# Patient Record
Sex: Female | Born: 1970 | Race: White | Hispanic: Yes | Smoking: Former smoker
Health system: Southern US, Community
[De-identification: ages and names within clinical notes are randomized; demographics above are authoritative.]

## PROBLEM LIST (undated history)

## (undated) DIAGNOSIS — E119 Type 2 diabetes mellitus without complications: Secondary | ICD-10-CM

## (undated) DIAGNOSIS — D649 Anemia, unspecified: Secondary | ICD-10-CM

## (undated) DIAGNOSIS — B351 Tinea unguium: Secondary | ICD-10-CM

## (undated) DIAGNOSIS — R748 Abnormal levels of other serum enzymes: Secondary | ICD-10-CM

## (undated) DIAGNOSIS — E669 Obesity, unspecified: Secondary | ICD-10-CM

## (undated) DIAGNOSIS — F329 Major depressive disorder, single episode, unspecified: Secondary | ICD-10-CM

## (undated) DIAGNOSIS — R739 Hyperglycemia, unspecified: Secondary | ICD-10-CM

## (undated) DIAGNOSIS — E7800 Pure hypercholesterolemia, unspecified: Secondary | ICD-10-CM

## (undated) DIAGNOSIS — F32A Depression, unspecified: Secondary | ICD-10-CM

## (undated) HISTORY — DX: Depression, unspecified: F32.A

## (undated) HISTORY — DX: Anemia, unspecified: D64.9

## (undated) HISTORY — PX: APPENDECTOMY: SHX54

## (undated) HISTORY — DX: Abnormal levels of other serum enzymes: R74.8

## (undated) HISTORY — DX: Pure hypercholesterolemia, unspecified: E78.00

## (undated) HISTORY — PX: TUBAL LIGATION: SHX77

## (undated) HISTORY — DX: Tinea unguium: B35.1

## (undated) HISTORY — DX: Hyperglycemia, unspecified: R73.9

## (undated) HISTORY — DX: Major depressive disorder, single episode, unspecified: F32.9

## (undated) HISTORY — DX: Obesity, unspecified: E66.9

---
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
## 2005-03-14 ENCOUNTER — Emergency Department (HOSPITAL_COMMUNITY): Admission: EM | Admit: 2005-03-14 | Discharge: 2005-03-14 | Payer: Self-pay | Admitting: Family Medicine

## 2009-01-14 ENCOUNTER — Other Ambulatory Visit: Admission: RE | Admit: 2009-01-14 | Discharge: 2009-01-14 | Payer: Self-pay | Admitting: Obstetrics and Gynecology

## 2009-10-30 ENCOUNTER — Emergency Department (HOSPITAL_COMMUNITY): Admission: EM | Admit: 2009-10-30 | Discharge: 2009-10-31 | Payer: Self-pay | Admitting: Emergency Medicine

## 2009-10-31 ENCOUNTER — Ambulatory Visit: Payer: Self-pay | Admitting: Psychiatry

## 2009-10-31 ENCOUNTER — Inpatient Hospital Stay (HOSPITAL_COMMUNITY): Admission: AD | Admit: 2009-10-31 | Discharge: 2009-11-03 | Payer: Self-pay | Admitting: Psychiatry

## 2009-11-05 ENCOUNTER — Encounter: Payer: Self-pay | Admitting: Internal Medicine

## 2009-11-12 ENCOUNTER — Ambulatory Visit: Payer: Self-pay | Admitting: Internal Medicine

## 2009-11-12 DIAGNOSIS — F329 Major depressive disorder, single episode, unspecified: Secondary | ICD-10-CM | POA: Insufficient documentation

## 2009-11-16 ENCOUNTER — Telehealth: Payer: Self-pay | Admitting: Internal Medicine

## 2009-12-17 ENCOUNTER — Ambulatory Visit: Payer: Self-pay | Admitting: Internal Medicine

## 2010-02-01 ENCOUNTER — Encounter (INDEPENDENT_AMBULATORY_CARE_PROVIDER_SITE_OTHER): Payer: Self-pay | Admitting: *Deleted

## 2010-06-08 NOTE — Letter (Signed)
Summary: Primary Care Appointment Letter  [facility] [place]  [address] [place], [place] [address]   Phone: [phone]  Fax: [phone]    02/01/2010 MRN: [id number]  [facility] [address] [place], [place]  [address]  Dear Ms. ARRIAGA-CASTILLO,   Your Primary Care Physician [place] E. Paz MD has indicated that:    __XX___it is time to schedule an appointment for a regular office visit.    _______you missed your appointment on______ and need to call and          reschedule.    _______you need to have lab work done.    _______you need to schedule an appointment discuss lab or test results.    _______you need to call to reschedule your appointment that is                       scheduled on _________.     Please call our office as soon as possible. Our phone number is [phone]. Our office is open 8a-12noon and 1p-5p, Monday through Friday.     Thank you,      [facility]uler    ---Sarah at extension [phone]

## 2010-06-08 NOTE — Progress Notes (Signed)
Summary: ophthalmology appointment   Phone Note Outgoing Call   Summary of Call: called patient patient aware she has a ophthalmology appointment with Dr. Jimmey Ralph (at the [facility] clinic) July 13 at 1.45 PM   She has a clinic number in case she needs to change the appointment Jose E. Paz MD  November 16, 2009 9:13 AM
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
## 2010-06-08 NOTE — Letter (Signed)
Summary: [facility]   Imported By: Lanelle Bal 11/16/2009 12:34:33  _____________________________________________________________________  External Attachment:    Type:   Image     Comment:   External Document

## 2010-06-08 NOTE — Assessment & Plan Note (Signed)
Summary: 1 MONTH FOLLOWUP////SPH   Vital Signs:  Patient profile:   40 year old female Weight:      181.50 pounds Pulse rate:   69 / minute Pulse rhythm:   regular BP sitting:   122 / 76  (left arm) Cuff size:   regular  Vitals Entered By: Army Fossa CMA (December 17, 2009 10:28 AM) CC: Pt here to f/u on Meds Comments Working well.   History of Present Illness: followup from last office visit Has not been able to see a counselor yet To see a ophthalmologist soon for removal of a cyst  Current Medications (verified): 1)  Celexa 20 Mg Tabs (Citalopram Hydrobromide) .Marland Kitchen.. 1 By Mouth Daily 2)  Trazodone  Allergies (verified): No Known Drug Allergies  Social History: Married children x 3  household: patient, husband and 1 daughter  patient works Teacher, English as a foreign language , 8h day, 7 days a week born in [place], [place] Current Smoker (trying to quit) Alcohol use- rarely Regular exercise-yes drugs-- no  Review of Systems       depression has decreased, no suicidal ideas Her asthma has not been abusive lately Complains of fatigue and feeling sleepy, she had the symptoms even before she started Celexa but at least a sleepy feeling has been getting worse. Not sleeping well at all despite taking trazodone  Physical Exam  General:  alert and well-developed.   Psych:  Oriented X3, memory intact for recent and remote, normally interactive, good eye contact, and not depressed appearing.  does not seem to be that anxious today   Impression & Recommendations:  Problem # 1:  DEPRESSION (ICD-311)  depression improved, no suicidal however citalopram is causing her to be very sleepy She also has insomnia, trazodone not helping Plan: counseled today Switch to fluoxetine Trial with Ambien Plan   discussed with the patient who is in agreement Will try again to get a counselor who speaks  Spanish  Her updated medication list for this problem includes:    Fluoxetine Hcl 20 Mg Tabs (Fluoxetine hcl)  ..... One by mouth daily  Orders: Misc. Referral (Misc. Ref)  Problem # 2:  F2F > 15 min, > 50% of the time counseling  Complete Medication List: 1)  Fluoxetine Hcl 20 Mg Tabs (Fluoxetine hcl) .... One by mouth daily 2)  Zolpidem Tartrate 10 Mg Tabs (Zolpidem tartrate) .... One by mouth at bedtime as needed for sleep   Patient Instructions: 1)  deje de tomar el citalopram 2)  empiexe fluoxetine 3)  tome ambien si no puede dormir 4)  regrese en 1 mes 5)  llame si tiene problemas con la nueva medicina 6)  needs an appointment in one month Prescriptions: ZOLPIDEM TARTRATE 10 MG TABS (ZOLPIDEM TARTRATE) one by mouth at bedtime as needed for sleep  #30 x 0   Entered and Authorized by:   Nolon Rod. Paz MD   Signed by:   Nolon Rod. Paz MD on 12/17/2009   Method used:   Print then Give to Patient   RxID:   [id number] FLUOXETINE HCL 20 MG TABS (FLUOXETINE HCL) one by mouth daily  #30 x 1   Entered and Authorized by:   Elita Quick E. Paz MD   Signed by:   Nolon Rod. Paz MD on 12/17/2009   Method used:   Print then Give to Patient   RxID:   [id number]

## 2010-07-25 LAB — DIFFERENTIAL
Basophils Absolute: 0 10*3/uL (ref 0.0–0.1)
Basophils Relative: 0 % (ref 0–1)
Eosinophils Relative: 2 % (ref 0–5)
Lymphocytes Relative: 26 % (ref 12–46)
Lymphs Abs: 2.6 10*3/uL (ref 0.7–4.0)
Neutrophils Relative %: 66 % (ref 43–77)

## 2010-07-25 LAB — URINALYSIS, ROUTINE W REFLEX MICROSCOPIC
Hgb urine dipstick: NEGATIVE
Urobilinogen, UA: 1 mg/dL (ref 0.0–1.0)
pH: 7.5 (ref 5.0–8.0)

## 2010-07-25 LAB — POCT PREGNANCY, URINE: Preg Test, Ur: NEGATIVE

## 2010-07-25 LAB — COMPREHENSIVE METABOLIC PANEL
ALT: 21 U/L (ref 0–35)
AST: 20 U/L (ref 0–37)
Albumin: 3.7 g/dL (ref 3.5–5.2)
BUN: 15 mg/dL (ref 6–23)
Calcium: 8.5 mg/dL (ref 8.4–10.5)
GFR calc non Af Amer: >60 mL/min (ref >60)
Glucose, Bld: 106 mg/dL — ABNORMAL HIGH (ref 70–99)
Potassium: 3.5 mEq/L (ref 3.5–5.1)
Sodium: 140 mEq/L (ref 135–145)
Total Protein: 6.9 g/dL (ref 6.0–8.3)

## 2010-07-25 LAB — URINE CULTURE: Colony Count: NO GROWTH

## 2010-07-25 LAB — ETHANOL: Alcohol, Ethyl (B): <5 mg/dL (ref 0–10)

## 2010-07-25 LAB — CBC
HCT: 36.6 % (ref 36.0–46.0)
Platelets: 203 10*3/uL (ref 150–400)
RDW: 13.2 % (ref 11.5–15.5)

## 2010-07-25 LAB — TSH: TSH: 0.726 u[IU]/mL (ref 0.350–4.500)

## 2010-07-25 LAB — ACETAMINOPHEN LEVEL: Acetaminophen (Tylenol), Serum: <10 ug/mL — ABNORMAL LOW (ref 10–30)

## 2011-03-16 ENCOUNTER — Encounter: Payer: Self-pay | Admitting: Internal Medicine

## 2011-04-04 ENCOUNTER — Encounter: Payer: Self-pay | Admitting: Internal Medicine

## 2011-04-08 ENCOUNTER — Encounter: Payer: Self-pay | Admitting: Internal Medicine

## 2011-04-08 ENCOUNTER — Ambulatory Visit (INDEPENDENT_AMBULATORY_CARE_PROVIDER_SITE_OTHER): Payer: PRIVATE HEALTH INSURANCE | Admitting: Internal Medicine

## 2011-04-08 DIAGNOSIS — Z0000 Encounter for general adult medical examination without abnormal findings: Secondary | ICD-10-CM | POA: Insufficient documentation

## 2011-04-08 DIAGNOSIS — F329 Major depressive disorder, single episode, unspecified: Secondary | ICD-10-CM

## 2011-04-08 DIAGNOSIS — Z23 Encounter for immunization: Secondary | ICD-10-CM

## 2011-04-08 NOTE — Assessment & Plan Note (Signed)
admited w/ depression 2011, was on SSRIs, she self d/c them b/c felt she didn't need them anymore. Does not see psych. I asked if separation from husband helped and she thinks so. Currently doing very well

## 2011-04-08 NOTE — Progress Notes (Signed)
  Subjective:    Patient ID: Alexis Warren, female    DOB: 03/02/1971, 40 y.o.   MRN: [id number]  HPI Complete physical exam  Past Medical History  Diagnosis Date  . Depression     admission 2011   Past Surgical History  Procedure Date  . Appendectomy   . Tubal ligation     in the 90s   History   Social History  . Marital Status: Married    Spouse Name: N/A    Number of Children: 3  . Years of Education: N/A   Occupational History  . Full Time worker @ [employer]    Social History Main Topics  . Smoking status: Current Some Day Smoker  . Smokeless tobacco: Never Used   Comment: smokes rarely   . Alcohol Use: No  . Drug Use: No  . Sexually Active: Not on file   Other Topics Concern  . Not on file   Social History Narrative   Born in [place]---Separated from husband, lives w/ two of her  children, daughter in Social worker and G-son---Exercise: very active at work, no routine exercise ---Diet: healthy mos t of the time    Family History  Problem Relation Age of Onset  . Diabetes Neg Hx   . Breast cancer Other     GM < 50   . Colon cancer Neg Hx   . Coronary artery disease      F MI? age 59s   Review of Systems No chest pain or shortness or breath No nausea, vomiting, diarrhea or blood in the stools. No dysuria or gross hematuria, occasionally has some vaginal itching and discharge. No vaginal bleeding. History of depression, self discontinued all meds, doing very well. Denies anxiety, sleeping well. Does not see her psychiatrist regularly anymore. Developed a cold 2 days ago, no fever or chills, no cough. Just nasal congestion.    Objective:   Physical Exam  Constitutional: She is oriented to person, place, and time. She appears well-developed and well-nourished. No distress.  HENT:  Head: Normocephalic and atraumatic.  Right Ear: External ear normal.  Left Ear: External ear normal.       Nose slightly congested, throat without redness. Face  symmetric, nontender to palpation  Neck: No thyromegaly present.  Cardiovascular: Normal rate, regular rhythm and normal heart sounds.   No murmur heard. Pulmonary/Chest: Effort normal and breath sounds normal. No respiratory distress. She has no wheezes. She has no rales.  Abdominal: Soft. Bowel sounds are normal. She exhibits no distension. There is no tenderness. There is no rebound and no guarding.  Musculoskeletal: She exhibits no edema.  Neurological: She is alert and oriented to person, place, and time.  Skin: She is not diaphoretic.  Psychiatric: She has a normal mood and affect. Her behavior is normal. Judgment and thought content normal.      Assessment & Plan:

## 2011-04-08 NOTE — Assessment & Plan Note (Addendum)
Td-- many years ago, gave one today Flu shot once she is better from her cold  Refer to gyn, last PAP ~ 2009 per pt  Last MMG ~ 2007 per pt  occ vag itching, rec OTC monistat until sees gyn labs Diet-exercise discussed

## 2011-04-08 NOTE — Patient Instructions (Signed)
Take calcium and vitamin D daily Monistat OTC Get a flu shot once your cold is better

## 2011-04-11 LAB — COMPREHENSIVE METABOLIC PANEL
AST: 24 U/L (ref 0–37)
CO2: 22 mEq/L (ref 19–32)
Creatinine, Ser: 0.7 mg/dL (ref 0.4–1.2)
Glucose, Bld: 59 mg/dL — ABNORMAL LOW (ref 70–99)
Sodium: 139 mEq/L (ref 135–145)
Total Bilirubin: 0.6 mg/dL (ref 0.3–1.2)

## 2011-04-11 LAB — CBC WITH DIFFERENTIAL/PLATELET
Basophils Absolute: 0 10*3/uL (ref 0.0–0.1)
Eosinophils Absolute: 0.5 10*3/uL (ref 0.0–0.7)
HCT: 37.5 % (ref 36.0–46.0)
Hemoglobin: 12.6 g/dL (ref 12.0–15.0)
MCHC: 33.7 g/dL (ref 30.0–36.0)
Neutro Abs: 4.9 10*3/uL (ref 1.4–7.7)
Platelets: 209 10*3/uL (ref 150.0–400.0)
RBC: 4.05 Mil/uL (ref 3.87–5.11)
RDW: 13.5 % (ref 11.5–14.6)
WBC: 7.6 10*3/uL (ref 4.5–10.5)

## 2011-04-11 LAB — LIPID PANEL: Triglycerides: 111 mg/dL (ref 0.0–149.0)

## 2011-04-11 LAB — TSH: TSH: 1.98 u[IU]/mL (ref 0.35–5.50)

## 2011-04-14 ENCOUNTER — Encounter: Payer: Self-pay | Admitting: Internal Medicine

## 2011-04-15 ENCOUNTER — Ambulatory Visit: Payer: PRIVATE HEALTH INSURANCE | Admitting: Gynecology

## 2011-05-09 ENCOUNTER — Telehealth: Payer: Self-pay | Admitting: Internal Medicine

## 2011-05-09 NOTE — Telephone Encounter (Signed)
noted 

## 2011-05-09 NOTE — Telephone Encounter (Signed)
In reference to 04-10-11 GYN Referral, I had patient scheduled to see Dr. Lily Peer & had to cancel the appointment.  I have attempted to call patient many times, have left multiple messages to return my call, and I also mailed a letter informing the patient to please call our office regarding her Gyn referral.  There has been no response as of today, 05-09-11.

## 2011-09-09 ENCOUNTER — Emergency Department (HOSPITAL_COMMUNITY): Payer: 59

## 2011-09-09 ENCOUNTER — Inpatient Hospital Stay (HOSPITAL_COMMUNITY)
Admission: EM | Admit: 2011-09-09 | Discharge: 2011-09-12 | DRG: 419 | Disposition: A | Discharge status: Home / Self Care | Payer: 59 | Attending: General Surgery | Admitting: General Surgery

## 2011-09-09 ENCOUNTER — Encounter (HOSPITAL_COMMUNITY): Payer: Self-pay | Admitting: Emergency Medicine

## 2011-09-09 DIAGNOSIS — K8000 Calculus of gallbladder with acute cholecystitis without obstruction: Secondary | ICD-10-CM

## 2011-09-09 DIAGNOSIS — K8050 Calculus of bile duct without cholangitis or cholecystitis without obstruction: Secondary | ICD-10-CM

## 2011-09-09 DIAGNOSIS — K8010 Calculus of gallbladder with chronic cholecystitis without obstruction: Secondary | ICD-10-CM | POA: Diagnosis present

## 2011-09-09 LAB — DIFFERENTIAL
Basophils Absolute: 0 10*3/uL (ref 0.0–0.1)
Basophils Relative: 1 % (ref 0–1)
Eosinophils Absolute: 0.5 10*3/uL (ref 0.0–0.7)
Eosinophils Relative: 6 % — ABNORMAL HIGH (ref 0–5)
Monocytes Absolute: 0.5 10*3/uL (ref 0.1–1.0)
Monocytes Relative: 7 % (ref 3–12)
Neutro Abs: 4.5 10*3/uL (ref 1.7–7.7)
Neutrophils Relative %: 56 % (ref 43–77)

## 2011-09-09 LAB — COMPREHENSIVE METABOLIC PANEL
ALT: 19 U/L (ref 0–35)
AST: 21 U/L (ref 0–37)
Alkaline Phosphatase: 94 U/L (ref 39–117)
BUN: 12 mg/dL (ref 6–23)
CO2: 24 mEq/L (ref 19–32)
Glucose, Bld: 118 mg/dL — ABNORMAL HIGH (ref 70–99)
Potassium: 3.8 mEq/L (ref 3.5–5.1)
Total Bilirubin: 0.3 mg/dL (ref 0.3–1.2)
Total Protein: 7.3 g/dL (ref 6.0–8.3)

## 2011-09-09 LAB — CBC
HCT: 36.2 % (ref 36.0–46.0)
MCH: 30.5 pg (ref 26.0–34.0)
MCHC: 34.8 g/dL (ref 30.0–36.0)
MCV: 87.7 fL (ref 78.0–100.0)
RBC: 4.13 MIL/uL (ref 3.87–5.11)
RDW: 12.9 % (ref 11.5–15.5)

## 2011-09-09 LAB — LIPASE, BLOOD: Lipase: 45 U/L (ref 11–59)

## 2011-09-09 LAB — URINALYSIS, ROUTINE W REFLEX MICROSCOPIC
Bilirubin Urine: NEGATIVE
Glucose, UA: NEGATIVE mg/dL
Leukocytes, UA: NEGATIVE

## 2011-09-09 LAB — POCT PREGNANCY, URINE: Preg Test, Ur: NEGATIVE

## 2011-09-09 MED ORDER — METOCLOPRAMIDE HCL 5 MG/ML IJ SOLN
10.0000 mg | Freq: Once | INTRAMUSCULAR | Status: AC
  Administered 2011-09-09: 10 mg via INTRAVENOUS
  Filled 2011-09-09: qty 2

## 2011-09-09 MED ORDER — DIPHENHYDRAMINE HCL 12.5 MG/5ML PO ELIX
12.5000 mg | ORAL_SOLUTION | Freq: Four times a day (QID) | ORAL | Status: DC | PRN
  Filled 2011-09-09: qty 10

## 2011-09-09 MED ORDER — ONDANSETRON HCL 4 MG/2ML IJ SOLN
4.0000 mg | Freq: Once | INTRAMUSCULAR | Status: AC
  Administered 2011-09-09: 4 mg via INTRAVENOUS
  Filled 2011-09-09: qty 2

## 2011-09-09 MED ORDER — HYDROMORPHONE HCL PF 1 MG/ML IJ SOLN
1.0000 mg | Freq: Once | INTRAMUSCULAR | Status: AC
  Administered 2011-09-09: 1 mg via INTRAVENOUS
  Filled 2011-09-09: qty 1

## 2011-09-09 MED ORDER — DIPHENHYDRAMINE HCL 50 MG/ML IJ SOLN: 12.5000 mg | Freq: Four times a day (QID) | INTRAMUSCULAR | Status: DC | PRN

## 2011-09-09 MED ORDER — SODIUM CHLORIDE 0.9 % IV BOLUS (SEPSIS)
500.0000 mL | Freq: Once | INTRAVENOUS | Status: AC
  Administered 2011-09-09: 09:00:00 via INTRAVENOUS

## 2011-09-09 MED ORDER — ONDANSETRON HCL 4 MG/2ML IJ SOLN
4.0000 mg | Freq: Four times a day (QID) | INTRAMUSCULAR | Status: DC | PRN
  Administered 2011-09-10 (×2): 4 mg via INTRAVENOUS
  Filled 2011-09-09 (×2): qty 2

## 2011-09-09 MED ORDER — ONDANSETRON HCL 4 MG/2ML IJ SOLN
INTRAMUSCULAR | Status: AC
  Administered 2011-09-09: 12:00:00
  Filled 2011-09-09: qty 2

## 2011-09-09 MED ORDER — KCL IN DEXTROSE-NACL 20-5-0.45 MEQ/L-%-% IV SOLN
INTRAVENOUS | Status: DC
  Administered 2011-09-10 – 2011-09-11 (×4): via INTRAVENOUS
  Filled 2011-09-09 (×11): qty 1000

## 2011-09-09 MED ORDER — HYDROMORPHONE HCL PF 1 MG/ML IJ SOLN
1.0000 mg | INTRAMUSCULAR | Status: DC | PRN
  Administered 2011-09-10 (×4): 1 mg via INTRAVENOUS
  Filled 2011-09-09 (×4): qty 1

## 2011-09-09 MED ORDER — SODIUM CHLORIDE 0.9 % IV SOLN
3.0000 g | Freq: Four times a day (QID) | INTRAVENOUS | Status: DC
  Administered 2011-09-10 – 2011-09-12 (×11): 3 g via INTRAVENOUS
  Filled 2011-09-09 (×14): qty 3

## 2011-09-09 MED ORDER — PANTOPRAZOLE SODIUM 40 MG IV SOLR
40.0000 mg | Freq: Every day | INTRAVENOUS | Status: DC
  Administered 2011-09-10 – 2011-09-11 (×2): 40 mg via INTRAVENOUS
  Filled 2011-09-09 (×3): qty 40

## 2011-09-09 NOTE — ED Provider Notes (Signed)
9:38 AM  Date: 09/09/2011  Rate: 71  Rhythm: normal sinus rhythm  QRS Axis: normal  Intervals: normal  ST/T Wave abnormalities: normal  Conduction Disutrbances:none  Narrative Interpretation: Normal EKG  Old EKG Reviewed: unchanged    Carleene Cooper III, MD 09/09/11 [phone]

## 2011-09-09 NOTE — ED Provider Notes (Signed)
2:08 PM Patient is in CDU holding for pain and nausea control.  Sign out received from [facility], PA-C.  Patient with cholelithiasis, stone around gallbladder neck, WBC normal, LFTs normal.  Pt had acute onset of pain early this morning.  States pain and nausea are unchanged.  Will attempt to control pain in CDU.  If unable, may require call to surgery.  If able to control symptoms, pt to be d/c home with pain and nausea medication, outpatient follow up with [facility].   3:43 PM Patient reports no improvement with medications.  States initially she did have some relief, but the past several doses have done nothing to change her pain.  Currently 8/10.  On exam, pt is A&O, NAD, RRR, CTAB, abd soft, nondistended, ttp throughout upper abdomen, no guarding, no rebound.    3:44 PM Patient discussed with Felicie Morn, NP, who assumes care of patient at change of shift.   3:55 PM Dr Rubin Payor updated on patient.  Patient not getting relief from medications, will likely need call to surgery in consult.    Rise Patience, PA 09/09/11 1544  Dillard Cannon [place], [place] 09/09/11 1555

## 2011-09-09 NOTE — ED Provider Notes (Signed)
Patient continues to have pain despite multiple doses of analgesia.  Contacted general surgery--they will be in to see.  11:03 PM Patient evaluated by surgery.  Patient admitted.  Jimmye Norman, NP 09/09/11 [phone]

## 2011-09-09 NOTE — ED Notes (Signed)
Pt c/o upper abd pain with nausea; pt denies vomiting or diarrhea

## 2011-09-09 NOTE — ED Notes (Signed)
MD at bedside. 

## 2011-09-09 NOTE — ED Provider Notes (Signed)
History     CSN: [id number]  Arrival date & time 09/09/11  0807   First MD Initiated Contact with Patient 09/09/11 [phone]      Chief Complaint  Patient presents with  . Abdominal Pain    (Consider location/radiation/quality/duration/timing/severity/associated sxs/prior treatment) HPI  Patient presents to the ER complaining of acute onset epigastric pain with associated nausea and vomiting that began at 4am this morning. Patient denies hx of similar pain. She states she has hx of appendectomy and tubal ligation. States she has hx of depression but takes no meds on a regular basis. Denies aggravating or alleviating factors. She took no medications PTA for symptoms. Symptoms were acute onset, persistent and unchanging. Denies lower abdominal pain, diarrhea, dysuria, hematuria, vaginal d/c or blood in her stool. She denies fevers, chills, CP or SOB.   Patient also states that she ate fried eggs and then fried chicken for dinner yesterday  Hx was obtained by patient through use of interpretor phone but there is also a family member at bedside who speaks fluent [place].   Past Medical History  Diagnosis Date  . Depression     admission 2011    Past Surgical History  Procedure Date  . Appendectomy   . Tubal ligation     in the 90s    Family History  Problem Relation Age of Onset  . Diabetes Neg Hx   . Breast cancer Other     GM < 50   . Colon cancer Neg Hx   . Coronary artery disease      F MI? age 42s    History  Substance Use Topics  . Smoking status: Current Some Day Smoker  . Smokeless tobacco: Never Used   Comment: smokes rarely   . Alcohol Use: No    OB History    Grav Para Term Preterm Abortions TAB SAB Ect Mult Living                  Review of Systems  All other systems reviewed and are negative.    Allergies  Review of patient's allergies indicates no known allergies.  Home Medications   Current Outpatient Rx  Name Route Sig Dispense Refill  .  PRESCRIPTION MEDICATION Topical Apply 1 application topically daily. Facial cream      BP 132/81  Pulse 76  Temp(Src) 97.8 F (36.6 C) (Oral)  Resp 20  SpO2 100%  Physical Exam  Nursing note and vitals reviewed. Constitutional: She is oriented to person, place, and time. She appears well-developed and well-nourished. No distress.       obese  HENT:  Head: Normocephalic and atraumatic.  Eyes: Conjunctivae are normal.  Neck: Normal range of motion. Neck supple.  Cardiovascular: Normal rate, regular rhythm, normal heart sounds and intact distal pulses.  Exam reveals no gallop and no friction rub.   No murmur heard. Pulmonary/Chest: Effort normal and breath sounds normal. No respiratory distress. She has no wheezes. She has no rales. She exhibits no tenderness.  Abdominal: Soft. Bowel sounds are normal. She exhibits no distension and no mass. There is tenderness. There is guarding. There is no rebound.       Moderate TTP of epigastric region without rigidity but guarding  Musculoskeletal: Normal range of motion. She exhibits no edema and no tenderness.  Neurological: She is alert and oriented to person, place, and time.  Skin: Skin is warm and dry. No rash noted. She is not diaphoretic. No erythema.  Psychiatric: She  has a normal mood and affect.    ED Course  Procedures (including critical care time)  IV dilaudid and zofran  Patient states pain has improved.  Will move to CDU to [facility] for ongoing pain management with ultimate hope for dispo home after pain control. Spoke at length with patient about gall stones and OP management with fat free diet and follow up with Gen Surg for elective cholecystectomy. Patient voices understanding and is agreeable to plan.   Labs Reviewed  DIFFERENTIAL - Abnormal; Notable for the following:    Eosinophils Relative 6 (*)    All other components within normal limits  COMPREHENSIVE METABOLIC PANEL - Abnormal; Notable for the following:     Glucose, Bld 118 (*)    All other components within normal limits  URINALYSIS, ROUTINE W REFLEX MICROSCOPIC - Abnormal; Notable for the following:    APPearance CLOUDY (*)    Hgb urine dipstick TRACE (*)    All other components within normal limits  URINE MICROSCOPIC-ADD ON - Abnormal; Notable for the following:    Squamous Epithelial / LPF FEW (*)    Bacteria, UA FEW (*)    Crystals CA OXALATE CRYSTALS (*)    All other components within normal limits  CBC  LIPASE, BLOOD  POCT PREGNANCY, URINE   US Abdomen Complete  09/09/2011  *RADIOLOGY REPORT*  Clinical Data:  Epigastric pain.  COMPLETE ABDOMINAL ULTRASOUND  Comparison:  None  Findings:  Gallbladder:  19 mm gallstone within the gallbladder.  No wall thickening.  Negative sonographic Murphy's.  Common bile duct:   Normal caliber, 3 mm.  Liver:  No focal lesion identified.  Within normal limits in parenchymal echogenicity.  IVC:  Appears normal.  Pancreas:  No focal abnormality seen.  Spleen:  Within normal limits in size and echotexture.  Right Kidney:   Normal in size and parenchymal echogenicity.  No evidence of mass or hydronephrosis.  Left Kidney:  Normal in size and parenchymal echogenicity.  No evidence of mass or hydronephrosis.  Abdominal aorta:  No aneurysm identified.  IMPRESSION: Cholelithiasis.  19 mm stone in the region of the gallbladder neck. No sonographic evidence of acute cholecystitis.  Original Report Authenticated By: Cyndie Chime, M.D.     No diagnosis found.    MDM          [place], PA 09/09/11 1208

## 2011-09-09 NOTE — H&P (Signed)
Alexis Warren is an 41 y.o. female.   Chief Complaint: RUQ abdominal pain HPI: 41 yo Hispanic female presents with acute onset of RUQ and epigastric abdominal pain since 4 AM.  She had fried chicken and fried eggs for dinner last night.  She reports some abdominal bloating after eating over the last couple of months.  She has never had such a severe attack.  She had some nausea/ vomiting this morning, but none since arrival.  Past Medical History  Diagnosis Date  . Depression     admission 2011    Past Surgical History  Procedure Date  . Appendectomy   . Tubal ligation     in the 90s    Family History  Problem Relation Age of Onset  . Diabetes Neg Hx   . Breast cancer Other     GM < 50   . Colon cancer Neg Hx   . Coronary artery disease      F MI? age 95s   Social History:  reports that she has been smoking.  She has never used smokeless tobacco. She reports that she does not drink alcohol or use illicit drugs.  Allergies: No Known Allergies  Meds- steroid skin ointment only  (Not in a hospital admission)  Results for orders placed during the hospital encounter of 09/09/11 (from the past 48 hour(s))  CBC     Status: Normal   Collection Time   09/09/11  9:08 AM      Component Value Range Comment   WBC 8.1  4.0 - 10.5 (K/uL)    RBC 4.13  3.87 - 5.11 (MIL/uL)    Hemoglobin 12.6  12.0 - 15.0 (g/dL)    HCT 16.1  09.6 - 04.5 (%)    MCV 87.7  78.0 - 100.0 (fL)    MCH 30.5  26.0 - 34.0 (pg)    MCHC 34.8  30.0 - 36.0 (g/dL)    RDW 40.9  81.1 - 91.4 (%)    Platelets 245  150 - 400 (K/uL)   DIFFERENTIAL     Status: Abnormal   Collection Time   09/09/11  9:08 AM      Component Value Range Comment   Neutrophils Relative 56  43 - 77 (%)    Neutro Abs 4.5  1.7 - 7.7 (K/uL)    Lymphocytes Relative 31  12 - 46 (%)    Lymphs Abs 2.5  0.7 - 4.0 (K/uL)    Monocytes Relative 7  3 - 12 (%)    Monocytes Absolute 0.5  0.1 - 1.0 (K/uL)    Eosinophils Relative 6 (*) 0 - 5 (%)     Eosinophils Absolute 0.5  0.0 - 0.7 (K/uL)    Basophils Relative 1  0 - 1 (%)    Basophils Absolute 0.0  0.0 - 0.1 (K/uL)   COMPREHENSIVE METABOLIC PANEL     Status: Abnormal   Collection Time   09/09/11  9:08 AM      Component Value Range Comment   Sodium 137  135 - 145 (mEq/L)    Potassium 3.8  3.5 - 5.1 (mEq/L)    Chloride 103  96 - 112 (mEq/L)    CO2 24  19 - 32 (mEq/L)    Glucose, Bld 118 (*) 70 - 99 (mg/dL)    BUN 12  6 - 23 (mg/dL)    Creatinine, Ser 7.82  0.50 - 1.10 (mg/dL)    Calcium 9.1  8.4 - 10.5 (mg/dL)  Total Protein 7.3  6.0 - 8.3 (g/dL)    Albumin 3.8  3.5 - 5.2 (g/dL)    AST 21  0 - 37 (U/L)    ALT 19  0 - 35 (U/L)    Alkaline Phosphatase 94  39 - 117 (U/L)    Total Bilirubin 0.3  0.3 - 1.2 (mg/dL)    GFR calc non Af Amer >90  >90 (mL/min)    GFR calc Af Amer >90  >90 (mL/min)   LIPASE, BLOOD     Status: Normal   Collection Time   09/09/11  9:08 AM      Component Value Range Comment   Lipase 45  11 - 59 (U/L)   URINALYSIS, ROUTINE W REFLEX MICROSCOPIC     Status: Abnormal   Collection Time   09/09/11  9:17 AM      Component Value Range Comment   Color, Urine YELLOW  YELLOW     APPearance CLOUDY (*) CLEAR     Specific Gravity, Urine 1.027  1.005 - 1.030     pH 5.0  5.0 - 8.0     Glucose, UA NEGATIVE  NEGATIVE (mg/dL)    Hgb urine dipstick TRACE (*) NEGATIVE     Bilirubin Urine NEGATIVE  NEGATIVE     Ketones, ur NEGATIVE  NEGATIVE (mg/dL)    Protein, ur NEGATIVE  NEGATIVE (mg/dL)    Urobilinogen, UA 0.2  0.0 - 1.0 (mg/dL)    Nitrite NEGATIVE  NEGATIVE     Leukocytes, UA NEGATIVE  NEGATIVE    URINE MICROSCOPIC-ADD ON     Status: Abnormal   Collection Time   09/09/11  9:17 AM      Component Value Range Comment   Squamous Epithelial / LPF FEW (*) RARE     WBC, UA 0-2  <3 (WBC/hpf)    RBC / HPF 0-2  <3 (RBC/hpf)    Bacteria, UA FEW (*) RARE     Crystals CA OXALATE CRYSTALS (*) NEGATIVE    POCT PREGNANCY, URINE     Status: Normal   Collection Time    09/09/11  9:24 AM      Component Value Range Comment   Preg Test, Ur NEGATIVE  NEGATIVE     US Abdomen Complete  09/09/2011  *RADIOLOGY REPORT*  Clinical Data:  Epigastric pain.  COMPLETE ABDOMINAL ULTRASOUND  Comparison:  None  Findings:  Gallbladder:  19 mm gallstone within the gallbladder.  No wall thickening.  Negative sonographic Murphy's.  Common bile duct:   Normal caliber, 3 mm.  Liver:  No focal lesion identified.  Within normal limits in parenchymal echogenicity.  IVC:  Appears normal.  Pancreas:  No focal abnormality seen.  Spleen:  Within normal limits in size and echotexture.  Right Kidney:   Normal in size and parenchymal echogenicity.  No evidence of mass or hydronephrosis.  Left Kidney:  Normal in size and parenchymal echogenicity.  No evidence of mass or hydronephrosis.  Abdominal aorta:  No aneurysm identified.  IMPRESSION: Cholelithiasis.  19 mm stone in the region of the gallbladder neck. No sonographic evidence of acute cholecystitis.  Original Report Authenticated By: Cyndie Chime, M.D.    Review of Systems  Eyes: Negative.   Gastrointestinal: Positive for nausea, vomiting and abdominal pain.  Neurological: Negative.   Endo/Heme/Allergies: Negative.     Blood pressure 138/83, pulse 90, temperature 97.8 F (36.6 C), temperature source Oral, resp. rate 18, SpO2 100.00%. 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  No sonographic or lab evidence of acute cholecystitis.  Will admit overnight for IV hydration, pain medication.  Plan  laparoscopic cholecystectomy with intraoperative cholangiogram tomorrow.  Discussed with patient and family via daughter who is fluent in both [place] and [place]  Ermina Oberman K. 09/09/2011, 11:00 PM

## 2011-09-09 NOTE — ED Notes (Signed)
Urine sample has been collected if needed. 

## 2011-09-10 ENCOUNTER — Encounter (HOSPITAL_COMMUNITY): Admission: EM | Disposition: A | Discharge status: Home / Self Care | Payer: Self-pay | Source: Home / Self Care

## 2011-09-10 ENCOUNTER — Encounter (HOSPITAL_COMMUNITY): Payer: Self-pay | Admitting: *Deleted

## 2011-09-10 LAB — CBC
HCT: 33.8 % — ABNORMAL LOW (ref 36.0–46.0)
MCHC: 34 g/dL (ref 30.0–36.0)
MCV: 88.3 fL (ref 78.0–100.0)
Platelets: 215 10*3/uL (ref 150–400)
RDW: 13.2 % (ref 11.5–15.5)
WBC: 14.5 10*3/uL — ABNORMAL HIGH (ref 4.0–10.5)

## 2011-09-10 LAB — COMPREHENSIVE METABOLIC PANEL
ALT: 15 U/L (ref 0–35)
Alkaline Phosphatase: 67 U/L (ref 39–117)
BUN: 5 mg/dL — ABNORMAL LOW (ref 6–23)
Chloride: 102 mEq/L (ref 96–112)
GFR calc Af Amer: >90 mL/min (ref >90)
Glucose, Bld: 115 mg/dL — ABNORMAL HIGH (ref 70–99)
Potassium: 3.3 mEq/L — ABNORMAL LOW (ref 3.5–5.1)
Sodium: 134 mEq/L — ABNORMAL LOW (ref 135–145)
Total Bilirubin: 0.5 mg/dL (ref 0.3–1.2)
Total Protein: 6.3 g/dL (ref 6.0–8.3)

## 2011-09-10 LAB — SURGICAL PCR SCREEN: MRSA, PCR: NEGATIVE

## 2011-09-10 SURGERY — LAPAROSCOPIC CHOLECYSTECTOMY WITH INTRAOPERATIVE CHOLANGIOGRAM
Anesthesia: General

## 2011-09-10 MED ORDER — HYDROMORPHONE HCL PF 1 MG/ML IJ SOLN
1.0000 mg | INTRAMUSCULAR | Status: DC | PRN
  Administered 2011-09-10 – 2011-09-12 (×4): 1 mg via INTRAVENOUS
  Filled 2011-09-10 (×4): qty 1

## 2011-09-10 MED ORDER — PROMETHAZINE HCL 25 MG/ML IJ SOLN
25.0000 mg | Freq: Four times a day (QID) | INTRAMUSCULAR | Status: DC | PRN
  Administered 2011-09-10 – 2011-09-11 (×5): 25 mg via INTRAVENOUS
  Filled 2011-09-10 (×5): qty 1

## 2011-09-10 MED ORDER — ACETAMINOPHEN 650 MG RE SUPP
650.0000 mg | Freq: Four times a day (QID) | RECTAL | Status: DC | PRN
  Administered 2011-09-10: 650 mg via RECTAL
  Filled 2011-09-10: qty 1

## 2011-09-10 MED ORDER — ACETAMINOPHEN 325 MG PO TABS
650.0000 mg | ORAL_TABLET | Freq: Four times a day (QID) | ORAL | Status: DC | PRN
  Administered 2011-09-11: 650 mg via ORAL
  Filled 2011-09-10: qty 2

## 2011-09-10 NOTE — Progress Notes (Signed)
Subjective: Pt still c/o RUQ pain. No n/v.  Objective: Vital signs in last 24 hours: Temp:  [97.5 F (36.4 C)-100.2 F (37.9 C)] 100.2 F (37.9 C) (05/04 0955) Pulse Rate:  [80-99] 97  (05/04 0955) Resp:  [16-18] 18  (05/04 0955) BP: (95-138)/(56-83) 95/56 mmHg (05/04 0955) SpO2:  [92 %-100 %] 92 % (05/04 0955) Weight:  [184 lb 6.4 oz (83.643 kg)] 184 lb 6.4 oz (83.643 kg) (05/04 0719)    Intake/Output from previous day: 05/03 0701 - 05/04 0700 In: 870.8 [I.V.:670.8; IV Piggyback:200] Out: -  Intake/Output this shift: Total I/O In: -  Out: 800 [Urine:800]  Alert, resting. cta Reg Soft, signif RUQ TTP. +murphy's sign  Lab Results:   Basename 09/10/11 0700 09/09/11 0908  WBC 14.5* 8.1  HGB 11.5* 12.6  HCT 33.8* 36.2  PLT 215 245   BMET  Basename 09/10/11 0700 09/09/11 0908  NA 134* 137  K 3.3* 3.8  CL 102 103  CO2 24 24  GLUCOSE 115* 118*  BUN 5* 12  CREATININE 0.51 0.55  CALCIUM 8.4 9.1   PT/INR No results found for this basename: LABPROT:2,INR:2 in the last 72 hours ABG No results found for this basename: PHART:2,PCO2:2,PO2:2,HCO3:2 in the last 72 hours  Studies/Results: US Abdomen Complete  09/09/2011  *RADIOLOGY REPORT*  Clinical Data:  Epigastric pain.  COMPLETE ABDOMINAL ULTRASOUND  Comparison:  None  Findings:  Gallbladder:  19 mm gallstone within the gallbladder.  No wall thickening.  Negative sonographic Murphy's.  Common bile duct:   Normal caliber, 3 mm.  Liver:  No focal lesion identified.  Within normal limits in parenchymal echogenicity.  IVC:  Appears normal.  Pancreas:  No focal abnormality seen.  Spleen:  Within normal limits in size and echotexture.  Right Kidney:   Normal in size and parenchymal echogenicity.  No evidence of mass or hydronephrosis.  Left Kidney:  Normal in size and parenchymal echogenicity.  No evidence of mass or hydronephrosis.  Abdominal aorta:  No aneurysm identified.  IMPRESSION: Cholelithiasis.  19 mm stone in the  region of the gallbladder neck. No sonographic evidence of acute cholecystitis.  Original Report Authenticated By: Cyndie Chime, M.D.    Anti-infectives: Anti-infectives     Start     Dose/Rate Route Frequency Ordered Stop   09/10/11 0100   Ampicillin-Sulbactam (UNASYN) 3 g in sodium chloride 0.9 % 100 mL IVPB        3 g 100 mL/hr over 60 Minutes Intravenous Every 6 hours 09/09/11 2356            Assessment/Plan: s/p Procedure(s) (LRB): Later today -->LAPAROSCOPIC CHOLECYSTECTOMY WITH INTRAOPERATIVE CHOLANGIOGRAM (N/A)  Likely acute on chronic cholecystitis. Given persistent RUQ pain, low grade temp, and increasing WBC Cont iv abx Cont npo Scds.  We discussed gallbladder disease. The patient was given Agricultural engineer. We discussed non-operative and operative management.   I discussed laparoscopic cholecystectomy with ioc in detail.  The patient was given educational material as well as diagrams detailing the procedure.  We discussed the risks and benefits of a laparoscopic cholecystectomy including, but not limited to bleeding, infection, injury to surrounding structures such as the intestine or liver, bile leak, retained gallstones, need to convert to an open procedure, prolonged diarrhea, blood clots such as  DVT, common bile duct injury, anesthesia risks, and possible need for additional procedures.  We discussed the typical post-operative recovery course. I explained that the likelihood of improvement of their symptoms is good. i explained that  she is at slightly higher risk because of her likely GB infection  Alexis Warren. Andrey Campanile, MD, FACS General, Bariatric, & Minimally Invasive Surgery [facility], [place]   LOS: 1 day    Atilano Ina 09/10/2011

## 2011-09-10 NOTE — ED Provider Notes (Signed)
Medical screening examination/treatment/procedure(s) were performed by non-physician practitioner and as supervising physician I was immediately available for consultation/collaboration.   Juliet Rude. Rubin Payor, MD 09/10/11 [phone]

## 2011-09-10 NOTE — Progress Notes (Signed)
Pt's temp was 101.2.  Dr. Gaynelle Adu called and notified, order for tylenol received.

## 2011-09-11 ENCOUNTER — Encounter (HOSPITAL_COMMUNITY): Payer: Self-pay | Admitting: Anesthesiology

## 2011-09-11 ENCOUNTER — Inpatient Hospital Stay (HOSPITAL_COMMUNITY): Payer: 59

## 2011-09-11 ENCOUNTER — Encounter (HOSPITAL_COMMUNITY): Admission: EM | Disposition: A | Discharge status: Home / Self Care | Payer: Self-pay | Source: Home / Self Care

## 2011-09-11 ENCOUNTER — Inpatient Hospital Stay (HOSPITAL_COMMUNITY): Payer: 59 | Admitting: Anesthesiology

## 2011-09-11 HISTORY — PX: CHOLECYSTECTOMY: SHX55

## 2011-09-11 LAB — COMPREHENSIVE METABOLIC PANEL
ALT: 17 U/L (ref 0–35)
AST: 16 U/L (ref 0–37)
Albumin: 2.7 g/dL — ABNORMAL LOW (ref 3.5–5.2)
Alkaline Phosphatase: 76 U/L (ref 39–117)
Potassium: 3.5 mEq/L (ref 3.5–5.1)
Sodium: 135 mEq/L (ref 135–145)
Total Protein: 6.2 g/dL (ref 6.0–8.3)

## 2011-09-11 LAB — DIFFERENTIAL
Basophils Absolute: 0 10*3/uL (ref 0.0–0.1)
Basophils Relative: 0 % (ref 0–1)
Eosinophils Relative: 1 % (ref 0–5)
Monocytes Absolute: 1.9 10*3/uL — ABNORMAL HIGH (ref 0.1–1.0)

## 2011-09-11 LAB — CBC
HCT: 34 % — ABNORMAL LOW (ref 36.0–46.0)
MCHC: 33.8 g/dL (ref 30.0–36.0)
MCV: 89 fL (ref 78.0–100.0)
RDW: 13.3 % (ref 11.5–15.5)

## 2011-09-11 SURGERY — LAPAROSCOPIC CHOLECYSTECTOMY WITH INTRAOPERATIVE CHOLANGIOGRAM
Anesthesia: General | Site: Abdomen | Wound class: Dirty or Infected

## 2011-09-11 MED ORDER — HYDROMORPHONE HCL PF 1 MG/ML IJ SOLN
0.2500 mg | INTRAMUSCULAR | Status: DC | PRN
  Administered 2011-09-11 (×4): 0.5 mg via INTRAVENOUS

## 2011-09-11 MED ORDER — OXYCODONE-ACETAMINOPHEN 5-325 MG PO TABS
1.0000 | ORAL_TABLET | ORAL | Status: DC | PRN
  Administered 2011-09-12: 2 via ORAL
  Filled 2011-09-11: qty 2

## 2011-09-11 MED ORDER — LIDOCAINE HCL (CARDIAC) 20 MG/ML IV SOLN
INTRAVENOUS | Status: DC | PRN
  Administered 2011-09-11: 80 mg via INTRAVENOUS

## 2011-09-11 MED ORDER — MORPHINE SULFATE 2 MG/ML IJ SOLN
2.0000 mg | INTRAMUSCULAR | Status: DC | PRN
  Administered 2011-09-11 (×2): 2 mg via INTRAVENOUS
  Filled 2011-09-11 (×2): qty 1

## 2011-09-11 MED ORDER — FENTANYL CITRATE 0.05 MG/ML IJ SOLN
INTRAMUSCULAR | Status: DC | PRN
  Administered 2011-09-11 (×2): 50 ug via INTRAVENOUS
  Administered 2011-09-11: 100 ug via INTRAVENOUS

## 2011-09-11 MED ORDER — SODIUM CHLORIDE 0.9 % IR SOLN
Status: DC | PRN
  Administered 2011-09-11: 1

## 2011-09-11 MED ORDER — KCL IN DEXTROSE-NACL 20-5-0.45 MEQ/L-%-% IV SOLN
INTRAVENOUS | Status: AC
  Filled 2011-09-11: qty 1000

## 2011-09-11 MED ORDER — ONDANSETRON HCL 4 MG/2ML IJ SOLN: 4.0000 mg | Freq: Once | INTRAMUSCULAR | Status: DC | PRN

## 2011-09-11 MED ORDER — KCL IN DEXTROSE-NACL 20-5-0.45 MEQ/L-%-% IV SOLN
INTRAVENOUS | Status: DC
  Administered 2011-09-11: 11:00:00 via INTRAVENOUS
  Filled 2011-09-11 (×6): qty 1000

## 2011-09-11 MED ORDER — IBUPROFEN 600 MG PO TABS
600.0000 mg | ORAL_TABLET | Freq: Four times a day (QID) | ORAL | Status: DC | PRN
  Filled 2011-09-11: qty 1

## 2011-09-11 MED ORDER — BUPIVACAINE-EPINEPHRINE 0.25% -1:200000 IJ SOLN
INTRAMUSCULAR | Status: DC | PRN
  Administered 2011-09-11: 14.5 mL

## 2011-09-11 MED ORDER — ONDANSETRON HCL 4 MG/2ML IJ SOLN: 4.0000 mg | Freq: Four times a day (QID) | INTRAMUSCULAR | Status: DC | PRN

## 2011-09-11 MED ORDER — ONDANSETRON HCL 4 MG/2ML IJ SOLN
INTRAMUSCULAR | Status: DC | PRN
  Administered 2011-09-11: 4 mg via INTRAVENOUS

## 2011-09-11 MED ORDER — NEOSTIGMINE METHYLSULFATE 1 MG/ML IJ SOLN
INTRAMUSCULAR | Status: DC | PRN
  Administered 2011-09-11: 3 mg via INTRAVENOUS

## 2011-09-11 MED ORDER — ROCURONIUM BROMIDE 100 MG/10ML IV SOLN
INTRAVENOUS | Status: DC | PRN
  Administered 2011-09-11: 50 mg via INTRAVENOUS

## 2011-09-11 MED ORDER — PROPOFOL 10 MG/ML IV EMUL
INTRAVENOUS | Status: DC | PRN
  Administered 2011-09-11: 200 mg via INTRAVENOUS

## 2011-09-11 MED ORDER — GLYCOPYRROLATE 0.2 MG/ML IJ SOLN
INTRAMUSCULAR | Status: DC | PRN
  Administered 2011-09-11: 0.4 mg via INTRAVENOUS

## 2011-09-11 MED ORDER — MORPHINE SULFATE 4 MG/ML IJ SOLN: 0.0500 mg/kg | INTRAMUSCULAR | Status: DC | PRN

## 2011-09-11 MED ORDER — HYDROMORPHONE HCL PF 1 MG/ML IJ SOLN
INTRAMUSCULAR | Status: AC
  Administered 2011-09-11: 1 mg
  Filled 2011-09-11: qty 1

## 2011-09-11 MED ORDER — ONDANSETRON HCL 4 MG PO TABS: 4.0000 mg | ORAL_TABLET | Freq: Four times a day (QID) | ORAL | Status: DC | PRN

## 2011-09-11 MED ORDER — LACTATED RINGERS IV SOLN
INTRAVENOUS | Status: DC | PRN
  Administered 2011-09-11: 09:00:00 via INTRAVENOUS

## 2011-09-11 MED ORDER — IOHEXOL 300 MG/ML  SOLN
INTRAMUSCULAR | Status: DC | PRN
  Administered 2011-09-11: 10:00:00

## 2011-09-11 SURGICAL SUPPLY — 43 items
APL SKNCLS STERI-STRIP NONHPOA (GAUZE/BANDAGES/DRESSINGS) ×1
APPLIER CLIP ROT 10 11.4 M/L (STAPLE) ×2
APR CLP MED LRG 11.4X10 (STAPLE) ×1
BAG SPEC RTRVL LRG 6X4 10 (ENDOMECHANICALS) ×1
BENZOIN TINCTURE PRP APPL 2/3 (GAUZE/BANDAGES/DRESSINGS) ×2 IMPLANT
BLADE SURG ROTATE 9660 (MISCELLANEOUS) IMPLANT
CANISTER SUCTION 2500CC (MISCELLANEOUS) ×2 IMPLANT
CHLORAPREP W/TINT 26ML (MISCELLANEOUS) ×2 IMPLANT
CLIP APPLIE ROT 10 11.4 M/L (STAPLE) ×1 IMPLANT
CLOTH BEACON ORANGE TIMEOUT ST (SAFETY) ×2 IMPLANT
COVER MAYO STAND STRL (DRAPES) ×2 IMPLANT
COVER SURGICAL LIGHT HANDLE (MISCELLANEOUS) ×2 IMPLANT
DECANTER SPIKE VIAL GLASS SM (MISCELLANEOUS) ×2 IMPLANT
DRAPE C-ARM 42X72 X-RAY (DRAPES) ×2 IMPLANT
DRAPE UTILITY 15X26 W/TAPE STR (DRAPE) ×4 IMPLANT
DRSG TEGADERM 4X4.75 (GAUZE/BANDAGES/DRESSINGS) ×2 IMPLANT
ELECT REM PT RETURN 9FT ADLT (ELECTROSURGICAL) ×2
ELECTRODE REM PT RTRN 9FT ADLT (ELECTROSURGICAL) ×1 IMPLANT
FILTER SMOKE EVAC LAPAROSHD (FILTER) ×2 IMPLANT
GAUZE SPONGE 2X2 8PLY STRL LF (GAUZE/BANDAGES/DRESSINGS) ×1 IMPLANT
GLOVE BIO SURGEON STRL SZ7 (GLOVE) ×3 IMPLANT
GLOVE BIOGEL PI IND STRL 7.5 (GLOVE) ×1 IMPLANT
GLOVE BIOGEL PI INDICATOR 7.5 (GLOVE) ×1
GOWN STRL NON-REIN LRG LVL3 (GOWN DISPOSABLE) ×8 IMPLANT
KIT BASIN OR (CUSTOM PROCEDURE TRAY) ×2 IMPLANT
KIT ROOM TURNOVER OR (KITS) ×2 IMPLANT
NS IRRIG 1000ML POUR BTL (IV SOLUTION) ×2 IMPLANT
PAD ARMBOARD 7.5X6 YLW CONV (MISCELLANEOUS) ×2 IMPLANT
POUCH SPECIMEN RETRIEVAL 10MM (ENDOMECHANICALS) ×2 IMPLANT
SCISSORS LAP 5X35 DISP (ENDOMECHANICALS) ×1 IMPLANT
SET CHOLANGIOGRAPH 5 50 .035 (SET/KITS/TRAYS/PACK) ×2 IMPLANT
SET IRRIG TUBING LAPAROSCOPIC (IRRIGATION / IRRIGATOR) ×2 IMPLANT
SLEEVE ENDOPATH XCEL 5M (ENDOMECHANICALS) ×2 IMPLANT
SPECIMEN JAR SMALL (MISCELLANEOUS) ×2 IMPLANT
SPONGE GAUZE 2X2 STER 10/PKG (GAUZE/BANDAGES/DRESSINGS) ×1
STRIP CLOSURE SKIN 1/2X4 (GAUZE/BANDAGES/DRESSINGS) ×1 IMPLANT
SUT MNCRL AB 4-0 PS2 18 (SUTURE) ×2 IMPLANT
TOWEL OR 17X24 6PK STRL BLUE (TOWEL DISPOSABLE) ×2 IMPLANT
TOWEL OR 17X26 10 PK STRL BLUE (TOWEL DISPOSABLE) ×2 IMPLANT
TRAY LAPAROSCOPIC (CUSTOM PROCEDURE TRAY) ×2 IMPLANT
TROCAR XCEL BLUNT TIP 100MML (ENDOMECHANICALS) ×2 IMPLANT
TROCAR XCEL NON-BLD 11X100MML (ENDOMECHANICALS) ×2 IMPLANT
TROCAR XCEL NON-BLD 5MMX100MML (ENDOMECHANICALS) ×2 IMPLANT

## 2011-09-11 NOTE — Anesthesia Procedure Notes (Signed)
Procedure Name: Intubation Date/Time: 09/11/2011 9:05 AM Performed by: Margaree Mackintosh Pre-anesthesia Checklist: Patient identified, Timeout performed, Emergency Drugs available, Suction available and Patient being monitored Patient Re-evaluated:Patient Re-evaluated prior to inductionOxygen Delivery Method: Circle system utilized Preoxygenation: Pre-oxygenation with 100% oxygen Intubation Type: IV induction Ventilation: Mask ventilation without difficulty Laryngoscope Size: Mac and 4 Grade View: Grade I Tube type: Oral Tube size: 7.5 mm Number of attempts: 1 Airway Equipment and Method: Stylet Placement Confirmation: ETT inserted through vocal cords under direct vision,  positive ETCO2 and breath sounds checked- equal and bilateral Secured at: 21 cm Tube secured with: Tape Dental Injury: Teeth and Oropharynx as per pre-operative assessment

## 2011-09-11 NOTE — Anesthesia Preprocedure Evaluation (Signed)
Anesthesia Evaluation  Patient identified by MRN, date of birth, ID band Patient awake    Airway Mallampati: II TM Distance: >3 FB Neck ROM: Full    Dental  (+) Teeth Intact and Dental Advisory Given   Pulmonary  breath sounds clear to auscultation        Cardiovascular Rhythm:Regular Rate:Normal     Neuro/Psych    GI/Hepatic   Endo/Other    Renal/GU      Musculoskeletal   Abdominal   Peds  Hematology   Anesthesia Other Findings   Reproductive/Obstetrics                           Anesthesia Physical Anesthesia Plan  ASA: I and Emergent  Anesthesia Plan: General   Post-op Pain Management:    Induction: Intravenous  Airway Management Planned:   Additional Equipment:   Intra-op Plan:   Post-operative Plan: Extubation in OR  Informed Consent: I have reviewed the patients History and Physical, chart, labs and discussed the procedure including the risks, benefits and alternatives for the proposed anesthesia with the patient or authorized representative who has indicated his/her understanding and acceptance.   Dental advisory given  Plan Discussed with: CRNA, Anesthesiologist and Surgeon  Anesthesia Plan Comments:         Anesthesia Quick Evaluation

## 2011-09-11 NOTE — Anesthesia Postprocedure Evaluation (Signed)
  Anesthesia Post-op Note  Patient: Alexis Warren  Procedure(s) Performed: Procedure(s) (LRB): LAPAROSCOPIC CHOLECYSTECTOMY WITH INTRAOPERATIVE CHOLANGIOGRAM (N/A)  Patient Location: PACU  Anesthesia Type: General  Level of Consciousness: awake, alert  and oriented  Airway and Oxygen Therapy: Patient Spontanous Breathing and Patient connected to nasal cannula oxygen  Post-op Pain: mild  Post-op Assessment: Post-op Vital signs reviewed  Post-op Vital Signs: Reviewed  Complications: No apparent anesthesia complications

## 2011-09-11 NOTE — Transfer of Care (Signed)
Immediate Anesthesia Transfer of Care Note  Patient: Alexis Warren  Procedure(s) Performed: Procedure(s) (LRB): LAPAROSCOPIC CHOLECYSTECTOMY WITH INTRAOPERATIVE CHOLANGIOGRAM (N/A)  Patient Location: PACU  Anesthesia Type: General  Level of Consciousness: awake, alert  and patient cooperative  Airway & Oxygen Therapy: Patient Spontanous Breathing and Patient connected to nasal cannula oxygen  Post-op Assessment: Report given to PACU RN and Post -op Vital signs reviewed and stable  Post vital signs: Reviewed and stable  Complications: No apparent anesthesia complications

## 2011-09-11 NOTE — Op Note (Signed)
Laparoscopic Cholecystectomy with IOC Procedure Note  Indications: This patient presents with symptomatic gallbladder disease and will undergo laparoscopic cholecystectomy.  Pre-operative Diagnosis: Calculus of gallbladder with acute cholecystitis, without mention of obstruction  Post-operative Diagnosis: Same  Surgeon: Jadah Bobak K.   Assistants: none  Anesthesia: General endotracheal anesthesia  ASA Class: 2  Procedure Details  The patient was seen again in the Holding Room. The risks, benefits, complications, treatment options, and expected outcomes were discussed with the patient. The possibilities of reaction to medication, pulmonary aspiration, perforation of viscus, bleeding, recurrent infection, finding a normal gallbladder, the need for additional procedures, failure to diagnose a condition, the possible need to convert to an open procedure, and creating a complication requiring transfusion or operation were discussed with the patient. The likelihood of improving the patient's symptoms with return to their baseline status is good.  The patient and/or family concurred with the proposed plan, giving informed consent. The site of surgery properly noted. The patient was taken to Operating Room, identified as Alexis Warren and the procedure verified as Laparoscopic Cholecystectomy with Intraoperative Cholangiogram. A Time Out was held and the above information confirmed.  Prior to the induction of general anesthesia, the patient was on therapeutic antibiotics. General endotracheal anesthesia was then administered and tolerated well. After the induction, the abdomen was prepped with Chloraprep and draped in the sterile fashion. The patient was positioned in the supine position.  Local anesthetic agent was injected into the skin near the umbilicus and an incision made. We dissected down to the abdominal fascia with blunt dissection.  The fascia was incised vertically and we  entered the peritoneal cavity bluntly.  A pursestring suture of 0-Vicryl was placed around the fascial opening.  The Hasson cannula was inserted and secured with the stay suture.  Pneumoperitoneum was then created with CO2 and tolerated well without any adverse changes in the patient's vital signs. An 11-mm port was placed in the subxiphoid position.  Two 5-mm ports were placed in the right upper quadrant. All skin incisions were infiltrated with a local anesthetic agent before making the incision and placing the trocars.   We positioned the patient in reverse Trendelenburg, tilted slightly to the patient's left.  The gallbladder was identified and was very swollen and erythematous.  We decompressed the gallbladder with the suction needle.  The fundus was grasped and retracted cephalad. Adhesions were lysed bluntly and with the electrocautery where indicated, taking care not to injure any adjacent organs or viscus. The infundibulum was grasped and retracted laterally, exposing the peritoneum overlying the triangle of Calot. This was then divided and exposed in a blunt fashion. A critical view of the cystic duct and cystic artery was obtained.  The cystic duct was clearly identified and bluntly dissected circumferentially. The cystic duct was ligated with a clip distally.   An incision was made in the cystic duct and the [facility] cholangiogram catheter introduced. The catheter was secured using a clip. A cholangiogram was then obtained which showed good visualization of the distal and proximal biliary tree with no sign of filling defects or obstruction.  Contrast flowed easily into the duodenum. The catheter was then removed.   The cystic duct was then ligated with clips and divided. The cystic artery was identified, dissected free, ligated with clips and divided as well.   The gallbladder was dissected from the liver bed in retrograde fashion with the electrocautery. This was very difficult due to the swollen  inflamed gallbladder.  Some bile was  spilled but no stones were spilled.The gallbladder was removed and placed in an Endocatch sac. The liver bed was irrigated and inspected. Hemostasis was achieved with the electrocautery. Copious irrigation was utilized and was repeatedly aspirated until clear.  The gallbladder and Endocatch sac were then removed through the umbilical port site.  The pursestring suture was used to close the umbilical fascia.    We again inspected the right upper quadrant for hemostasis.  Pneumoperitoneum was released as we removed the trocars.  4-0 Monocryl was used to close the skin.   Benzoin, steri-strips, and clean dressings were applied. The patient was then extubated and brought to the recovery room in stable condition. Instrument, sponge, and needle counts were correct at closure and at the conclusion of the case.   Findings: Cholecystitis with Cholelithiasis  Estimated Blood Loss: Minimal         Drains: none         Specimens: Gallbladder           Complications: None; patient tolerated the procedure well.         Disposition: PACU - hemodynamically stable.         Condition: stable  Wilmon Arms. Corliss Skains, MD, [facility] Surgery  09/11/2011 10:35 AM

## 2011-09-11 NOTE — Preoperative (Signed)
Beta Blockers   Reason not to administer Beta Blockers:Not Applicable 

## 2011-09-12 ENCOUNTER — Encounter (HOSPITAL_COMMUNITY): Payer: Self-pay | Admitting: *Deleted

## 2011-09-12 MED ORDER — OXYCODONE-ACETAMINOPHEN 5-325 MG PO TABS: 1.0000 | ORAL_TABLET | ORAL | Status: AC | PRN

## 2011-09-12 NOTE — ED Provider Notes (Signed)
Medical screening examination/treatment/procedure(s) were performed by non-physician practitioner and as supervising physician I was immediately available for consultation/collaboration.   Carleene Cooper III, MD 09/12/11 [phone]

## 2011-09-12 NOTE — Discharge Instructions (Signed)
[facility] ______[facility] °LAPAROSCOPIC SURGERY: POST OP INSTRUCTIONS °Always review your discharge instruction sheet given to you by the facility where your surgery was performed. °IF YOU HAVE DISABILITY OR FAMILY LEAVE FORMS, YOU MUST BRING THEM TO THE OFFICE FOR PROCESSING.   °DO NOT GIVE THEM TO YOUR DOCTOR. ° °1. A prescription for pain medication may be given to you upon discharge.  Take your pain medication as prescribed, if needed.  If narcotic pain medicine is not needed, then you may take acetaminophen (Tylenol) or ibuprofen (Advil) as needed. °2. Take your usually prescribed medications unless otherwise directed. °3. If you need a refill on your pain medication, please contact your pharmacy.  They will contact our office to request authorization. Prescriptions will not be filled after 5pm or on week-ends. °4. You should follow a light diet the first few days after arrival home, such as soup and crackers, etc.  Be sure to include lots of fluids daily. °5. Most patients will experience some swelling and bruising in the area of the incisions.  Ice packs will help.  Swelling and bruising can take several days to resolve.  °6. It is common to experience some constipation if taking pain medication after surgery.  Increasing fluid intake and taking a stool softener (such as Colace) will usually help or prevent this problem from occurring.  A mild laxative (Milk of Magnesia or Miralax) should be taken according to package instructions if there are no bowel movements after 48 hours. °7. Unless discharge instructions indicate otherwise, you may remove your bandages 24-48 hours after surgery, and you may shower at that time.  You may have steri-strips (small skin tapes) in place directly over the incision.  These strips should be left on the skin for 7-10 days.  If your surgeon used skin glue on the incision, you may shower in 24 hours.  The glue will flake off over the next 2-3 weeks.  Any sutures or  staples will be removed at the office during your follow-up visit. °8. ACTIVITIES:  You may resume regular (light) daily activities beginning the next day--such as daily self-care, walking, climbing stairs--gradually increasing activities as tolerated.  You may have sexual intercourse when it is comfortable.  Refrain from any heavy lifting or straining until approved by your doctor. °a. You may drive when you are no longer taking prescription pain medication, you can comfortably wear a seatbelt, and you can safely maneuver your car and apply brakes. °b. RETURN TO WORK:  __________________________________________________________ °9. You should see your doctor in the office for a follow-up appointment approximately 2-3 weeks after your surgery.  Make sure that you call for this appointment within a day or two after you arrive home to insure a convenient appointment time. °10. OTHER INSTRUCTIONS: __________________________________________________________________________________________________________________________ __________________________________________________________________________________________________________________________ °WHEN TO CALL YOUR DOCTOR: °1. Fever over 101.0 °2. Inability to urinate °3. Continued bleeding from incision. °4. Increased pain, redness, or drainage from the incision. °5. Increasing abdominal pain ° °The clinic staff is available to answer your questions during regular business hours.  Please don’t hesitate to call and ask to speak to one of the nurses for clinical concerns.  If you have a medical emergency, go to the nearest emergency room or call 911.  A surgeon from [facility] is always on call at the hospital. °[address], [address], [place], [place]  [address] ? [address] [address], [place], [place]   [address] °[phone] ? [phone] ? FAX [phone] °Web site:   [email or web address] °

## 2011-09-12 NOTE — Discharge Summary (Signed)
Patient ID: SHANESSA HODAK MRN: [id number] DOB/AGE: 12/28/1970 41 y.o.  Admit date: 09/09/2011 Discharge date: 09/12/2011  Procedures: lap chole  Consults: None  Reason for Admission: This is a 41 yo female who presented to the [facility] with RUQ abdominal pain and was found to have acute cholecystitis.  Admission Diagnoses:  1. Acute cholecystitis  Hospital Course: The patient was admitted and taken to the OR for a lap chole.  She tolerated the procedure well.  Her diet was advanced to regular food and oral pain medications on POD#1.  Her incisions were c/d/i.  She was otherwise stable for discharge home.  PE: Abd: soft, appropriately tender, +BS, ND, incisions c/d/i  Discharge Diagnoses:  1. Acute cholecystitis 2. S/p lap chole  Discharge Medications: Medication List  As of 09/12/2011 10:00 AM   TAKE these medications         fluticasone 0.05 % cream   Commonly known as: CUTIVATE   Apply 1 application topically 2 (two) times daily.      oxyCODONE-acetaminophen 5-325 MG per tablet   Commonly known as: PERCOCET   Take 1-2 tablets by mouth every 4 (four) hours as needed.            Discharge Instructions: Follow-up Information    Follow up with TSUEI,MATTHEW K., MD. Schedule an appointment as soon as possible for a visit in 3 weeks.   Contact information:   [employer] [address] [place] [address] [phone]          Signed: Letha Cape 09/12/2011, 10:00 AM

## 2011-09-12 NOTE — ED Provider Notes (Signed)
Medical screening examination/treatment/procedure(s) were conducted as a shared visit with non-physician practitioner(s) and myself.  I personally evaluated the patient during the encounter 41 year old woman with RUQ pain, RUQ tenderness, ultrasound showing a large gallstone.  Will try to control her pain, observe in [employer].  Carleene Cooper III, MD 09/12/11 1141

## 2011-09-12 NOTE — Progress Notes (Signed)
Discharged home. Home discharge instruction given, no question verbalized. Tolerated lunch with no nausea and vomiting.

## 2011-09-12 NOTE — Discharge Summary (Signed)
Rodger Giangregorio M. Mico Spark, MD, FACS General, Bariatric, & Minimally Invasive Surgery [facility]  

## 2011-09-13 ENCOUNTER — Encounter (HOSPITAL_COMMUNITY): Payer: Self-pay | Admitting: Surgery

## 2011-09-29 ENCOUNTER — Encounter (INDEPENDENT_AMBULATORY_CARE_PROVIDER_SITE_OTHER): Payer: Self-pay | Admitting: Surgery

## 2011-09-29 ENCOUNTER — Ambulatory Visit (INDEPENDENT_AMBULATORY_CARE_PROVIDER_SITE_OTHER): Payer: 59 | Admitting: Surgery

## 2011-09-29 VITALS — BP 108/80 | HR 69 | Temp 97.8°F | Ht 63.0 in | Wt 180.2 lb

## 2011-09-29 DIAGNOSIS — K810 Acute cholecystitis: Secondary | ICD-10-CM | POA: Insufficient documentation

## 2011-09-29 NOTE — Progress Notes (Signed)
Patient ID: Alexis Warren, female   DOB: 1971/03/28, 41 y.o.   MRN: [id number]  S/p laparoscopic cholecystectomy with intraoperative cholangiogram on 09/11/11 for acute cholecystitis.  The patient complains of some soreness around the right-sided port sites.  Appetite and bowel movements are returning to normal.  No complaints of diarrhea.  Filed Vitals:   09/29/11 1058  BP: 108/80  Pulse: 69  Temp: 97.8 F (36.6 C)   Incisions well-healed with no sign of infection. Mild RUQ tenderness associated with port sites, but no hematoma or infection noted  Imp:  Acute cholecystitis s/p lap chole - doing well Plan:  Resume regular diet.  Return to work in 2 weeks. Follow-up PRN.  Wilmon Arms. Corliss Skains, MD, [facility]  09/29/2011 2:31 PM

## 2012-05-07 ENCOUNTER — Encounter (HOSPITAL_COMMUNITY): Payer: Self-pay | Admitting: Emergency Medicine

## 2012-05-07 ENCOUNTER — Other Ambulatory Visit (HOSPITAL_COMMUNITY)
Admission: RE | Admit: 2012-05-07 | Discharge: 2012-05-07 | Disposition: A | Discharge status: Home / Self Care | Payer: 59 | Source: Ambulatory Visit | Attending: Emergency Medicine | Admitting: Emergency Medicine

## 2012-05-07 ENCOUNTER — Emergency Department (HOSPITAL_COMMUNITY)
Admission: EM | Admit: 2012-05-07 | Discharge: 2012-05-07 | Disposition: A | Discharge status: Home / Self Care | Payer: 59 | Source: Home / Self Care | Attending: Emergency Medicine | Admitting: Emergency Medicine

## 2012-05-07 DIAGNOSIS — Z113 Encounter for screening for infections with a predominantly sexual mode of transmission: Secondary | ICD-10-CM | POA: Insufficient documentation

## 2012-05-07 DIAGNOSIS — N949 Unspecified condition associated with female genital organs and menstrual cycle: Secondary | ICD-10-CM

## 2012-05-07 DIAGNOSIS — N760 Acute vaginitis: Secondary | ICD-10-CM | POA: Insufficient documentation

## 2012-05-07 LAB — POCT URINALYSIS DIP (DEVICE)
Ketones, ur: NEGATIVE mg/dL
Protein, ur: NEGATIVE mg/dL
Specific Gravity, Urine: 1.025 (ref 1.005–1.030)
pH: 5.5 (ref 5.0–8.0)

## 2012-05-07 LAB — POCT PREGNANCY, URINE: Preg Test, Ur: NEGATIVE

## 2012-05-07 MED ORDER — FLUCONAZOLE 100 MG PO TABS: 100.0000 mg | ORAL_TABLET | Freq: Every day | ORAL | Status: DC

## 2012-05-07 NOTE — ED Provider Notes (Signed)
History     CSN: [id number]  Arrival date & time 05/07/12  1001   First MD Initiated Contact with Patient 05/07/12 1023      Chief Complaint  Patient presents with  . Vaginal Itching    (Consider location/radiation/quality/duration/timing/severity/associated sxs/prior treatment) HPI Comments: Patient presents urgent care, describing the for about a month she's been experiencing itchiness and soreness in her external genitalia. She had been seen by her Dr. and she informs [place] that she's been trying with over-the-counter vagisil, with no improvement. She denies any vaginal discharge but does describe some light material in her external genitalia, it's very sore at touch over the friction and movement. She denies a history of herpes or  any other STDs. Patient describes some discomfort when she urinates but is not all the time. She denies any fevers, flank pain, suprapubic pain, nausea or vomiting.  Patient is a 41 y.o. female presenting with vaginal itching. The history is provided by the patient.  Vaginal Itching This is a recurrent problem. The problem occurs constantly. The problem has been gradually worsening. Pertinent negatives include no abdominal pain. The treatment provided no relief.    Past Medical History  Diagnosis Date  . Depression     admission 2011    Past Surgical History  Procedure Date  . Appendectomy   . Tubal ligation     in the 90s  . Cholecystectomy 09/11/2011    Procedure: LAPAROSCOPIC CHOLECYSTECTOMY WITH INTRAOPERATIVE CHOLANGIOGRAM;  Surgeon: Wilmon Arms. Corliss Skains, MD;  Location: [facility] OR;  Service: General;  Laterality: N/A;    Family History  Problem Relation Age of Onset  . Diabetes Neg Hx   . Colon cancer Neg Hx   . Breast cancer Other     GM < 50   . Coronary artery disease      F MI? age 59s  . Heart disease Father   . Cancer Maternal Grandmother     breast    History  Substance Use Topics  . Smoking status: Never Smoker   . Smokeless  tobacco: Never Used     Comment: smokes rarely   . Alcohol Use: No    OB History    Grav Para Term Preterm Abortions TAB SAB Ect Mult Living                  Review of Systems  Constitutional: Negative for chills, diaphoresis, activity change, fatigue and unexpected weight change.  HENT: Negative.   Gastrointestinal: Negative for nausea, vomiting and abdominal pain.  Genitourinary: Positive for dysuria and vaginal pain. Negative for decreased urine volume, vaginal bleeding, vaginal discharge, difficulty urinating and pelvic pain.  Skin: Negative for rash and wound.    Allergies  Review of patient's allergies indicates no known allergies.  Home Medications   Current Outpatient Rx  Name  Route  Sig  Dispense  Refill  . FLUCONAZOLE 100 MG PO TABS   Oral   Take 1 tablet (100 mg total) by mouth daily.   2 tablet   0   . FLUTICASONE PROPIONATE 0.05 % EX CREA   Topical   Apply 1 application topically 2 (two) times daily.           BP 109/70  Pulse 62  Temp 97.7 F (36.5 C) (Oral)  Resp 18  SpO2 98%  LMP 05/07/2012  Physical Exam  Nursing note and vitals reviewed. Constitutional: She appears well-developed and well-nourished. No distress.  Genitourinary:    No labial fusion.  There is rash and tenderness on the right labia. There is no injury on the right labia. There is rash and tenderness on the left labia. There is no injury on the left labia. No erythema, tenderness or bleeding around the vagina. No foreign body around the vagina. No signs of injury around the vagina. No vaginal discharge found.  Neurological: She is alert.  Skin: No erythema. No pallor.    ED Course  Procedures (including critical care time)   Labs Reviewed  POCT URINALYSIS DIP (DEVICE)  CERVICOVAGINAL ANCILLARY ONLY  HERPES SIMPLEX VIRUS CULTURE   No results found.   1. Vaginal discomfort     Samples have been obtained for detection of herpes. Candida vaginitis, and other  STDs.  MDM  Patient has been seen by her PCP, explain today that we have obtained several samples for further study and that she will be called if treatment is necessary. Have provider her with a Diflucan prescription to try empirically, she agrees and understands. She has also been advised that if this irritation continues despite treatment and negative test results she will need to see a gynecologist to rule out a genitalia or full for malignancy. For this could be atrophic changes of her external genitalia related to hormonal disbalance is have recommended to followup with her GYN doctor phone number was provided to her to call the women's clinic she agrees and understands discharge instructions.        Jimmie Molly, MD 05/07/12 [phone]

## 2012-05-07 NOTE — ED Notes (Signed)
Pt c/o vaginal itching x1 month... The itching has been getting worse x1 week... "feels like little paper cuts" per pt... Stings when she urinates and hurts to sit down... Denies: abd/back pain, vag discharge, fevers, vomiting, nauseas, diarrhea... She is alert w/no signs of acute distress.

## 2012-05-09 LAB — HERPES SIMPLEX VIRUS CULTURE

## 2012-05-09 NOTE — ED Notes (Signed)
Patient presented to [facility] registration, stating she was told to f/u in a few days.  Upon review of medical record and discharge instructions from previous visit, was told we were only going to contact her if any of her tests came back abnormal.  Informed pt that her lab results were negative.  Instructed to f/u with GYN as mentioned per Dr. Ladon Applebaum.  States she is still having vaginal itching despite taking Rx fluconazole.  Educated that it can take a few days before she will notice a difference.  Pt and family verbalized understanding.  Instructed to go to [facility] for any worsening sxs.

## 2012-06-12 ENCOUNTER — Emergency Department (INDEPENDENT_AMBULATORY_CARE_PROVIDER_SITE_OTHER)
Admission: EM | Admit: 2012-06-12 | Discharge: 2012-06-12 | Disposition: A | Discharge status: Home / Self Care | Payer: Self-pay | Source: Home / Self Care | Attending: Family Medicine | Admitting: Family Medicine

## 2012-06-12 ENCOUNTER — Other Ambulatory Visit (HOSPITAL_COMMUNITY)
Admission: RE | Admit: 2012-06-12 | Discharge: 2012-06-12 | Disposition: A | Discharge status: Home / Self Care | Payer: Self-pay | Source: Ambulatory Visit | Attending: Family Medicine | Admitting: Family Medicine

## 2012-06-12 ENCOUNTER — Encounter (HOSPITAL_COMMUNITY): Payer: Self-pay | Admitting: *Deleted

## 2012-06-12 DIAGNOSIS — N760 Acute vaginitis: Secondary | ICD-10-CM

## 2012-06-12 MED ORDER — METRONIDAZOLE 0.75 % VA GEL: 1.0000 | VAGINAL | Status: DC

## 2012-06-12 NOTE — ED Provider Notes (Signed)
History     CSN: [id number]  Arrival date & time 06/12/12  1032   First MD Initiated Contact with Patient 06/12/12 1117      Chief Complaint  Patient presents with  . Vaginitis    (Consider location/radiation/quality/duration/timing/severity/associated sxs/prior treatment) Patient is a 42 y.o. female presenting with vaginal itching. The history is provided by the patient.  Vaginal Itching This is a recurrent problem. The current episode started more than 1 week ago (seen in [place] for same and sx persist., primarily itching.). The problem has not changed since onset.Pertinent negatives include no abdominal pain.    Past Medical History  Diagnosis Date  . Depression     admission 2011    Past Surgical History  Procedure Date  . Appendectomy   . Tubal ligation     in the 90s  . Cholecystectomy 09/11/2011    Procedure: LAPAROSCOPIC CHOLECYSTECTOMY WITH INTRAOPERATIVE CHOLANGIOGRAM;  Surgeon: Wilmon Arms. Corliss Skains, MD;  Location: [facility] OR;  Service: General;  Laterality: N/A;    Family History  Problem Relation Age of Onset  . Diabetes Neg Hx   . Colon cancer Neg Hx   . Breast cancer Other     GM < 50   . Coronary artery disease      F MI? age 79s  . Heart disease Father   . Cancer Maternal Grandmother     breast    History  Substance Use Topics  . Smoking status: Never Smoker   . Smokeless tobacco: Never Used     Comment: smokes rarely   . Alcohol Use: No    OB History    Grav Para Term Preterm Abortions TAB SAB Ect Mult Living                  Review of Systems  Constitutional: Negative.   Gastrointestinal: Negative for abdominal pain.  Genitourinary: Negative for urgency, vaginal bleeding, vaginal discharge, vaginal pain, menstrual problem and pelvic pain.    Allergies  Review of patient's allergies indicates no known allergies.  Home Medications   Current Outpatient Rx  Name  Route  Sig  Dispense  Refill  . FLUCONAZOLE 100 MG PO TABS   Oral   Take 1  tablet (100 mg total) by mouth daily.   2 tablet   0   . FLUTICASONE PROPIONATE 0.05 % EX CREA   Topical   Apply 1 application topically 2 (two) times daily.         Marland Kitchen METRONIDAZOLE 0.75 % VA GEL   Vaginal   Place 1 Applicatorful vaginally 1 day or 1 dose. At bedtime for 5 nights   70 g   0     BP 114/79  Pulse 71  Temp 97.6 F (36.4 C) (Oral)  Resp 16  SpO2 97%  Physical Exam  Nursing note and vitals reviewed. Constitutional: She is oriented to person, place, and time. She appears well-developed and well-nourished.  Abdominal: Soft. Bowel sounds are normal. She exhibits no mass. There is no tenderness.  Genitourinary: Vagina normal and uterus normal. No vaginal discharge found.  Neurological: She is alert and oriented to person, place, and time.  Skin: Skin is warm and dry.  Psychiatric: She has a normal mood and affect.    ED Course  Procedures (including critical care time)   Labs Reviewed  CERVICOVAGINAL ANCILLARY ONLY   No results found.   1. Vaginitis       MDM  Linna Hoff, MD 06/12/12 1344

## 2012-06-12 NOTE — ED Notes (Signed)
Pt reports vaginal itching and pain in lower abdomen - has been seen previously for the same symptoms - all test results negative - pt states that pain and discharge continue.

## 2012-07-10 ENCOUNTER — Encounter (HOSPITAL_COMMUNITY): Payer: Self-pay | Admitting: *Deleted

## 2012-07-10 ENCOUNTER — Emergency Department (HOSPITAL_COMMUNITY): Payer: Worker's Compensation

## 2012-07-10 ENCOUNTER — Emergency Department (HOSPITAL_COMMUNITY)
Admission: EM | Admit: 2012-07-10 | Discharge: 2012-07-10 | Disposition: A | Discharge status: Home / Self Care | Payer: Worker's Compensation | Attending: Emergency Medicine | Admitting: Emergency Medicine

## 2012-07-10 DIAGNOSIS — Y9389 Activity, other specified: Secondary | ICD-10-CM | POA: Insufficient documentation

## 2012-07-10 DIAGNOSIS — Y990 Civilian activity done for income or pay: Secondary | ICD-10-CM | POA: Insufficient documentation

## 2012-07-10 DIAGNOSIS — S61209A Unspecified open wound of unspecified finger without damage to nail, initial encounter: Secondary | ICD-10-CM | POA: Insufficient documentation

## 2012-07-10 DIAGNOSIS — Y9289 Other specified places as the place of occurrence of the external cause: Secondary | ICD-10-CM | POA: Insufficient documentation

## 2012-07-10 DIAGNOSIS — W268XXA Contact with other sharp object(s), not elsewhere classified, initial encounter: Secondary | ICD-10-CM | POA: Insufficient documentation

## 2012-07-10 DIAGNOSIS — IMO0002 Reserved for concepts with insufficient information to code with codable children: Secondary | ICD-10-CM | POA: Insufficient documentation

## 2012-07-10 DIAGNOSIS — Z8659 Personal history of other mental and behavioral disorders: Secondary | ICD-10-CM | POA: Insufficient documentation

## 2012-07-10 MED ORDER — OXYCODONE-ACETAMINOPHEN 5-325 MG PO TABS: 2.0000 | ORAL_TABLET | ORAL | Status: DC | PRN

## 2012-07-10 MED ORDER — OXYCODONE-ACETAMINOPHEN 5-325 MG PO TABS
2.0000 | ORAL_TABLET | Freq: Once | ORAL | Status: AC
  Administered 2012-07-10: 2 via ORAL
  Filled 2012-07-10: qty 2

## 2012-07-10 MED ORDER — CEPHALEXIN 500 MG PO CAPS: 500.0000 mg | ORAL_CAPSULE | Freq: Four times a day (QID) | ORAL | Status: DC

## 2012-07-10 NOTE — ED Provider Notes (Signed)
History  This chart was scribed for Glynn Octave, MD by Shari Heritage, ED Scribe. The patient was seen in room APA08/APA08. Patient's care was started at 1938.  CSN: [id number]  Arrival date & time 07/10/12  1916   First MD Initiated Contact with Patient 07/10/12 1938      Chief Complaint  Patient presents with  . Hand Injury     The history is provided by the patient. A language interpreter was used ([place]).    HPI Comments: Alexis Warren is a 42 y.o. female who presents to the Emergency Department complaining of moderate to severe, constant, non-radiating left hand pain immediately prior to arrival. Pain is mostly localized at 3rd, 4th and 5th fingers. There are superficial laceration and abrasions to these fingers as well. Patient states that she sustained the injury while at work. 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 She states that she reached in with her left hand to retrieve the spatula, but the blender was still churning and the metal blades struck her hand. There is no numbness or weakness to in the left hand or fingers. She denies any other pain or injuries at this time. She has a history of depression, but no other pertinent medical history. She has no known allergies to medicines. Tdap is UTD.  Past Medical History  Diagnosis Date  . Depression     admission 2011    Past Surgical History  Procedure Laterality Date  . Appendectomy    . Tubal ligation      in the 90s  . Cholecystectomy  09/11/2011    Procedure: LAPAROSCOPIC CHOLECYSTECTOMY WITH INTRAOPERATIVE CHOLANGIOGRAM;  Surgeon: Wilmon Arms. Corliss Skains, MD;  Location: [facility] OR;  Service: General;  Laterality: N/A;    Family History  Problem Relation Age of Onset  . Diabetes Neg Hx   . Colon cancer Neg Hx   . Breast cancer Other     GM < 50   . Coronary artery disease      F MI? age 15s  . Heart disease Father   . Cancer Maternal Grandmother     breast    History   Substance Use Topics  . Smoking status: Never Smoker   . Smokeless tobacco: Never Used     Comment: smokes rarely   . Alcohol Use: No    OB History   Grav Para Term Preterm Abortions TAB SAB Ect Mult Living                  Review of Systems A complete 10 system review of systems was obtained and all systems are negative except as noted in the HPI and PMH.   Allergies  Review of patient's allergies indicates not on file.  Home Medications  No current outpatient prescriptions on file.  Triage Vitals: BP 112/75  Pulse 70  Temp(Src) 97.9 F (36.6 C) (Oral)  Resp 18  Ht 5\' 4"  (1.626 m)  Wt 193 lb 9 oz (87.799 kg)  BMI 33.21 kg/m2  SpO2 100%  Physical Exam  Constitutional: She is oriented to person, place, and time. She appears well-developed and well-nourished.  HENT:  Head: Normocephalic and atraumatic.  Eyes: Conjunctivae and EOM are normal. Pupils are equal, round, and reactive to light.  Cardiovascular: Normal rate.   Pulmonary/Chest: Effort normal.  Musculoskeletal:  Superficial laceration and abrasion to dorsal 5th DIP. Abrasion to the middle phalanx of 4th and 3rd fingers. Reduced ROM secondary to pain. Sensation  intact. Capillary refill intact. +2 radial pulse on the left.  Full passive ROM of 3rd and 4th digits.  Pain to ROM of 5th DIP and PIP.  Neurological: She is alert and oriented to person, place, and time.  Skin: Skin is warm and dry. Abrasion and laceration noted.  Psychiatric: She has a normal mood and affect. Her behavior is normal.    ED Course  Procedures (including critical care time) DIAGNOSTIC STUDIES: Oxygen Saturation is 100% on room air, normal by my interpretation.    COORDINATION OF CARE: 7:49 PM- Will order x-ray of left hand. Will give 2 tablets of Percocet 5-325 mg. Patient informed of current plan for treatment and evaluation and agrees with plan at this time.   8:15 PM- Hand cleaned by ED staff. Updated patient on x-ray results  which show fracture of middle phalanx of 5th finger on left hand. Will page hand surgery.   Dg Hand Complete Left  07/10/2012  *RADIOLOGY REPORT*  Clinical Data: Injury, pain.  LEFT HAND - COMPLETE 3+ VIEW  Comparison: None.  Findings: The patient has a fracture through the neck of the middle phalanx of the little finger.  There is minimal impaction.  No other acute bony or joint abnormality is identified.  IMPRESSION: Fracture neck of the middle phalanx of the little finger.   Original Report Authenticated By: Holley Dexter, M.D.      No diagnosis found.    MDM  Patient caught her hand a piece of machinery at work. She has superficial abrasions to her of third, fourth and fifth digits of the left hand. Sensation is intact.  X-ray remarkable for middle phalanx fracture of the little finger. We'll treat as open fracture even though there is a very superficial wound without apparent tendon or nerve involvement.  We'll splint and treat with Keflex.  Discussed with Dr. Amanda Pea of hand surgery who agrees with treatment. He will see patient in his office at 1 PM tomorrow      I personally performed the services described in this documentation, which was scribed in my presence. The recorded information has been reviewed and is accurate.   Glynn Octave, MD 07/10/12 2128

## 2012-07-10 NOTE — ED Notes (Signed)
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  Pt's left third, forth and fifth digits have superficial lacerations, bleeding controlled. Minimal edema and bruising noted to said fingers. Cap refill brisk. Radial pulse strong and equal. Tetanus within the last year per pt and translator. Ice pack applied after wound cleansed per order.  Interrupter used by EDP via phone. Pt verbalized understand to plan of care.

## 2012-07-10 NOTE — ED Notes (Addendum)
Pt smashed fingers to left hand while grabbing a rake to clean large machine, machine pulled at rake and with smashed pt's fingers, pt with limited ENglish

## 2012-07-10 NOTE — ED Notes (Signed)
Finger splint applied to third and fifth fingers after wounds cleansed and bacitracin applied. Pt verbalized understanding of keeping fingers/wounds clean and dry.

## 2012-07-11 ENCOUNTER — Other Ambulatory Visit: Payer: Self-pay | Admitting: Obstetrics & Gynecology

## 2012-07-11 DIAGNOSIS — Z1231 Encounter for screening mammogram for malignant neoplasm of breast: Secondary | ICD-10-CM

## 2012-08-17 ENCOUNTER — Ambulatory Visit
Admission: RE | Admit: 2012-08-17 | Discharge: 2012-08-17 | Disposition: A | Discharge status: Home / Self Care | Payer: Managed Care, Other (non HMO) | Source: Ambulatory Visit | Attending: Obstetrics & Gynecology | Admitting: Obstetrics & Gynecology

## 2012-08-17 ENCOUNTER — Other Ambulatory Visit: Payer: Self-pay | Admitting: Obstetrics & Gynecology

## 2012-08-17 DIAGNOSIS — R928 Other abnormal and inconclusive findings on diagnostic imaging of breast: Secondary | ICD-10-CM

## 2012-08-17 DIAGNOSIS — Z1231 Encounter for screening mammogram for malignant neoplasm of breast: Secondary | ICD-10-CM

## 2012-11-21 ENCOUNTER — Ambulatory Visit
Admission: RE | Admit: 2012-11-21 | Discharge: 2012-11-21 | Disposition: A | Discharge status: Home / Self Care | Payer: Managed Care, Other (non HMO) | Source: Ambulatory Visit | Attending: Obstetrics & Gynecology | Admitting: Obstetrics & Gynecology

## 2012-11-21 ENCOUNTER — Other Ambulatory Visit: Payer: Self-pay | Admitting: Obstetrics & Gynecology

## 2012-11-21 DIAGNOSIS — R928 Other abnormal and inconclusive findings on diagnostic imaging of breast: Secondary | ICD-10-CM

## 2014-04-20 ENCOUNTER — Emergency Department (HOSPITAL_COMMUNITY)
Admission: EM | Admit: 2014-04-20 | Discharge: 2014-04-20 | Disposition: A | Discharge status: Home / Self Care | Payer: Managed Care, Other (non HMO) | Attending: Emergency Medicine | Admitting: Emergency Medicine

## 2014-04-20 ENCOUNTER — Encounter (HOSPITAL_COMMUNITY): Payer: Self-pay | Admitting: Emergency Medicine

## 2014-04-20 DIAGNOSIS — T543X1A Toxic effect of corrosive alkalis and alkali-like substances, accidental (unintentional), initial encounter: Secondary | ICD-10-CM | POA: Insufficient documentation

## 2014-04-20 DIAGNOSIS — Z792 Long term (current) use of antibiotics: Secondary | ICD-10-CM | POA: Insufficient documentation

## 2014-04-20 DIAGNOSIS — X58XXXA Exposure to other specified factors, initial encounter: Secondary | ICD-10-CM | POA: Insufficient documentation

## 2014-04-20 DIAGNOSIS — Z8659 Personal history of other mental and behavioral disorders: Secondary | ICD-10-CM | POA: Insufficient documentation

## 2014-04-20 DIAGNOSIS — Y92091 Bathroom in other non-institutional residence as the place of occurrence of the external cause: Secondary | ICD-10-CM | POA: Insufficient documentation

## 2014-04-20 DIAGNOSIS — Y93E5 Activity, floor mopping and cleaning: Secondary | ICD-10-CM | POA: Insufficient documentation

## 2014-04-20 DIAGNOSIS — T6591XA Toxic effect of unspecified substance, accidental (unintentional), initial encounter: Secondary | ICD-10-CM

## 2014-04-20 DIAGNOSIS — Y998 Other external cause status: Secondary | ICD-10-CM | POA: Insufficient documentation

## 2014-04-20 MED ORDER — SODIUM CHLORIDE 0.9 % IV BOLUS (SEPSIS)
1000.0000 mL | Freq: Once | INTRAVENOUS | Status: AC
  Administered 2014-04-20: 1000 mL via INTRAVENOUS

## 2014-04-20 MED ORDER — OXYCODONE-ACETAMINOPHEN 5-325 MG PO TABS: 2.0000 | ORAL_TABLET | ORAL | Status: DC | PRN

## 2014-04-20 MED ORDER — LIDOCAINE VISCOUS 2 % MT SOLN
15.0000 mL | Freq: Once | OROMUCOSAL | Status: AC
  Administered 2014-04-20: 15 mL via OROMUCOSAL
  Filled 2014-04-20: qty 15

## 2014-04-20 MED ORDER — LORAZEPAM 2 MG/ML IJ SOLN
0.5000 mg | Freq: Once | INTRAMUSCULAR | Status: AC
  Administered 2014-04-20: 0.5 mg via INTRAVENOUS
  Filled 2014-04-20: qty 1

## 2014-04-20 NOTE — ED Provider Notes (Signed)
CSN: [id number]     Arrival date & time 04/20/14  1037 History   First MD Initiated Contact with Patient 04/20/14 1037     Chief Complaint  Patient presents with  . Ingestion      HPI  Patient presents after accidental ingestion of pneumonia. Patient is a cleaning lady. She accidentally swallowed a small amount of pneumonia just prior to ED evaluation. Since that time she has a pain in the sternal and epigastric areas. There is nausea, and she had one episode of vomiting. No dyspnea, no other pain, no other complaints.   Past Medical History  Diagnosis Date  . Depression     admission 2011   Past Surgical History  Procedure Laterality Date  . Appendectomy    . Tubal ligation      in the 90s  . Cholecystectomy  09/11/2011    Procedure: LAPAROSCOPIC CHOLECYSTECTOMY WITH INTRAOPERATIVE CHOLANGIOGRAM;  Surgeon: Wilmon ArmsMatthew K. Corliss Skainssuei, MD;  Location: [facility] OR;  Service: General;  Laterality: N/A;   Family History  Problem Relation Age of Onset  . Diabetes Neg Hx   . Colon cancer Neg Hx   . Breast cancer Other     GM < 50   . Coronary artery disease      F MI? age 6760s  . Heart disease Father   . Cancer Maternal Grandmother     breast   History  Substance Use Topics  . Smoking status: Never Smoker   . Smokeless tobacco: Never Used     Comment: smokes rarely   . Alcohol Use: No   OB History    No data available     Review of Systems  All other systems reviewed and are negative.     Allergies  Review of patient's allergies indicates not on file.  Home Medications   Prior to Admission medications   Medication Sig Start Date End Date Taking? Authorizing Provider  cephALEXin (KEFLEX) 500 MG capsule Take 1 capsule (500 mg total) by mouth 4 (four) times daily. 07/10/12   Glynn OctaveStephen Rancour, MD  oxyCODONE-acetaminophen (PERCOCET/ROXICET) 5-325 MG per tablet Take 2 tablets by mouth every 4 (four) hours as needed for pain. 07/10/12   Glynn OctaveStephen Rancour, MD   SpO2 100% Physical Exam   Constitutional: She is oriented to person, place, and time. She appears well-developed and well-nourished.  Uncomfortable appearing female with emesis basin, drooling  HENT:  Head: Normocephalic and atraumatic.  Mouth/Throat: Uvula is midline. No uvula swelling. No oropharyngeal exudate, posterior oropharyngeal edema or posterior oropharyngeal erythema.    Eyes: Conjunctivae and EOM are normal.  Cardiovascular: Normal rate and regular rhythm.   Pulmonary/Chest: Effort normal and breath sounds normal. No stridor. No respiratory distress.  Abdominal: She exhibits no distension.  Musculoskeletal: She exhibits no edema.  Neurological: She is alert and oriented to person, place, and time. No cranial nerve deficit.  Skin: Skin is warm and dry.  Psychiatric: She has a normal mood and affect.  Nursing note and vitals reviewed.   ED Course  Procedures (including critical care time) After the initial evaluation I reviewed material data substance information, post ventral information. Subsequently I discussed available topical medication with our pharmacy team. Patient received IV fluids, topical lidocaine.  3:35 PM Patient asleep. MDM  Patient presents after accidental ingestion of a pneumonia.  Patient has evidence for minor irritation of the anterior oropharynx, but no posterior oropharynx changes, and after provision of medication, saline, she was asleep. Patient remained hemodynamically stable throughout,  with no evidence for erosive effects, pneumomediastinum, or other acute pathology. Patient was discharged in stable condition to follow-up with primary care tomorrow.   Gerhard Munchobert Ramla Hase, MD 04/20/14 [phone]

## 2014-04-20 NOTE — Discharge Instructions (Signed)
It is very important that you contact your primary care physician to ensure appropriate resolution of your illness.  Return here for concerning changes in your condition.

## 2014-04-20 NOTE — ED Notes (Signed)
Pt from home with c/o was cleaning the bathroom and wanted some water she grabbed the cub with ammonia in it instead and drank it. Pt vomiting clear fluids. C/o burning in her throat.

## 2014-04-20 NOTE — ED Notes (Signed)
Received pt via EMS with c/o ingesting ammonia. Pt actively vomiting.

## 2014-04-20 NOTE — ED Notes (Signed)
Patient transported to Ultrasound 

## 2014-04-21 ENCOUNTER — Emergency Department (HOSPITAL_COMMUNITY)
Admission: EM | Admit: 2014-04-21 | Discharge: 2014-04-22 | Disposition: A | Discharge status: Other Acute Inpatient Hospital | Payer: Managed Care, Other (non HMO) | Attending: Emergency Medicine | Admitting: Emergency Medicine

## 2014-04-21 ENCOUNTER — Encounter (HOSPITAL_COMMUNITY): Payer: Self-pay | Admitting: Emergency Medicine

## 2014-04-21 DIAGNOSIS — X788XXA Intentional self-harm by other sharp object, initial encounter: Secondary | ICD-10-CM | POA: Insufficient documentation

## 2014-04-21 DIAGNOSIS — F329 Major depressive disorder, single episode, unspecified: Secondary | ICD-10-CM | POA: Insufficient documentation

## 2014-04-21 DIAGNOSIS — Y9389 Activity, other specified: Secondary | ICD-10-CM | POA: Insufficient documentation

## 2014-04-21 DIAGNOSIS — Y9289 Other specified places as the place of occurrence of the external cause: Secondary | ICD-10-CM | POA: Insufficient documentation

## 2014-04-21 DIAGNOSIS — R45851 Suicidal ideations: Secondary | ICD-10-CM

## 2014-04-21 DIAGNOSIS — T1491XA Suicide attempt, initial encounter: Secondary | ICD-10-CM

## 2014-04-21 DIAGNOSIS — Y998 Other external cause status: Secondary | ICD-10-CM | POA: Insufficient documentation

## 2014-04-21 DIAGNOSIS — S61512A Laceration without foreign body of left wrist, initial encounter: Secondary | ICD-10-CM | POA: Insufficient documentation

## 2014-04-21 LAB — COMPREHENSIVE METABOLIC PANEL
ALBUMIN: 3.6 g/dL (ref 3.5–5.2)
ALT: 13 U/L (ref 0–35)
ANION GAP: 17 — AB (ref 5–15)
AST: 19 U/L (ref 0–37)
Alkaline Phosphatase: 70 U/L (ref 39–117)
BILIRUBIN TOTAL: 0.4 mg/dL (ref 0.3–1.2)
BUN: 12 mg/dL (ref 6–23)
CALCIUM: 9 mg/dL (ref 8.4–10.5)
CHLORIDE: 103 meq/L (ref 96–112)
CO2: 19 mEq/L (ref 19–32)
CREATININE: 0.6 mg/dL (ref 0.50–1.10)
GFR calc Af Amer: >90 mL/min (ref >90)
GFR calc non Af Amer: >90 mL/min (ref >90)
Glucose, Bld: 100 mg/dL — ABNORMAL HIGH (ref 70–99)
Potassium: 3.8 mEq/L (ref 3.7–5.3)
Sodium: 139 mEq/L (ref 137–147)
Total Protein: 7.3 g/dL (ref 6.0–8.3)

## 2014-04-21 LAB — CBC WITH DIFFERENTIAL/PLATELET
BASOS ABS: 0 10*3/uL (ref 0.0–0.1)
BASOS PCT: 1 % (ref 0–1)
EOS ABS: 0.4 10*3/uL (ref 0.0–0.7)
EOS PCT: 6 % — AB (ref 0–5)
HEMATOCRIT: 37.4 % (ref 36.0–46.0)
HEMOGLOBIN: 12.4 g/dL (ref 12.0–15.0)
Lymphocytes Relative: 47 % — ABNORMAL HIGH (ref 12–46)
Lymphs Abs: 2.9 10*3/uL (ref 0.7–4.0)
MCH: 29.6 pg (ref 26.0–34.0)
MCHC: 33.2 g/dL (ref 30.0–36.0)
MCV: 89.3 fL (ref 78.0–100.0)
MONO ABS: 0.5 10*3/uL (ref 0.1–1.0)
MONOS PCT: 8 % (ref 3–12)
NEUTROS ABS: 2.3 10*3/uL (ref 1.7–7.7)
Neutrophils Relative %: 38 % — ABNORMAL LOW (ref 43–77)
Platelets: 230 10*3/uL (ref 150–400)
RBC: 4.19 MIL/uL (ref 3.87–5.11)
RDW: 13.2 % (ref 11.5–15.5)
WBC: 6.1 10*3/uL (ref 4.0–10.5)

## 2014-04-21 LAB — ACETAMINOPHEN LEVEL: Acetaminophen (Tylenol), Serum: <15 ug/mL (ref 10–30)

## 2014-04-21 LAB — ETHANOL: Alcohol, Ethyl (B): <11 mg/dL (ref 0–11)

## 2014-04-21 LAB — SALICYLATE LEVEL: Salicylate Lvl: <2 mg/dL — ABNORMAL LOW (ref 2.8–20.0)

## 2014-04-21 MED ORDER — IBUPROFEN 200 MG PO TABS: 600.0000 mg | ORAL_TABLET | Freq: Three times a day (TID) | ORAL | Status: DC | PRN

## 2014-04-21 MED ORDER — ZOLPIDEM TARTRATE 5 MG PO TABS: 5.0000 mg | ORAL_TABLET | Freq: Every evening | ORAL | Status: DC | PRN

## 2014-04-21 MED ORDER — ACETAMINOPHEN 325 MG PO TABS: 650.0000 mg | ORAL_TABLET | ORAL | Status: DC | PRN

## 2014-04-21 MED ORDER — ONDANSETRON HCL 4 MG PO TABS: 4.0000 mg | ORAL_TABLET | Freq: Three times a day (TID) | ORAL | Status: DC | PRN

## 2014-04-21 MED ORDER — ALUM & MAG HYDROXIDE-SIMETH 200-200-20 MG/5ML PO SUSP: 30.0000 mL | ORAL | Status: DC | PRN

## 2014-04-21 MED ORDER — LIDOCAINE HCL (PF) 1 % IJ SOLN
5.0000 mL | Freq: Once | INTRAMUSCULAR | Status: AC
  Administered 2014-04-21: 5 mL
  Filled 2014-04-21: qty 5

## 2014-04-21 MED ORDER — NICOTINE 21 MG/24HR TD PT24: 21.0000 mg | MEDICATED_PATCH | Freq: Every day | TRANSDERMAL | Status: DC

## 2014-04-21 MED ORDER — LORAZEPAM 1 MG PO TABS
1.0000 mg | ORAL_TABLET | Freq: Three times a day (TID) | ORAL | Status: DC | PRN
  Administered 2014-04-21: 1 mg via ORAL
  Filled 2014-04-21: qty 1

## 2014-04-21 NOTE — ED Notes (Signed)
Pt. wanded by security at triage .  

## 2014-04-21 NOTE — ED Notes (Signed)
Pt. presents with self - inflicted superficial laceration at left anterior wrist approx. 2 inches long with minimal bleeding , son reported that pt. is depressed and was seen here last night after ingesting ammonia and was discharged home .

## 2014-04-21 NOTE — ED Notes (Signed)
PA at bedside.

## 2014-04-21 NOTE — ED Provider Notes (Signed)
CSN: [id number]     Arrival date & time 04/21/14  2123 History   First MD Initiated Contact with Patient 04/21/14 2202     Chief Complaint  Patient presents with  . Suicidal  . Extremity Laceration     (Consider location/radiation/quality/duration/timing/severity/associated sxs/prior Treatment) The history is provided by the patient.     Pt with hx depression presents after suicide attempt.  States "I don't have desire to live." ("No tengo ganas de vivir").  She was brought in by her son after he found that she had cut her left wrist.  States she did this because she doesn't want to be alive anymore.  States the ingestion she came in for yesterday was not accidental, that she was trying to kill herself.  States she has been depressed and suicidal for 1 year, denies any event that precipitated this.  Denies HI.  States she still has some occasional nausea and SOB since yesterday's ingestion of ammonia. When asked about physical complaints she states "I feel nothing." ("No siento nada").  Denies any prior suicide attempts.  Denies HI.  States her tetanus vx is up to date within the past 5 years.  Past Medical History  Diagnosis Date  . Depression     admission 2011   Past Surgical History  Procedure Laterality Date  . Appendectomy    . Tubal ligation      in the 90s  . Cholecystectomy  09/11/2011    Procedure: LAPAROSCOPIC CHOLECYSTECTOMY WITH INTRAOPERATIVE CHOLANGIOGRAM;  Surgeon: Wilmon ArmsMatthew K. Corliss Skainssuei, MD;  Location: [facility];  Service: General;  Laterality: N/A;   Family History  Problem Relation Age of Onset  . Diabetes Neg Hx   . Colon cancer Neg Hx   . Breast cancer Other     GM < 50   . Coronary artery disease      F MI? age 4060s  . Heart disease Father   . Cancer Maternal Grandmother     breast   History  Substance Use Topics  . Smoking status: Never Smoker   . Smokeless tobacco: Never Used     Comment: smokes rarely   . Alcohol Use: No   OB History    No data available      Review of Systems  All other systems reviewed and are negative.     Allergies  Review of patient's allergies indicates no known allergies.  Home Medications   Prior to Admission medications   Medication Sig Start Date End Date Taking? Authorizing Provider  oxyCODONE-acetaminophen (PERCOCET/ROXICET) 5-325 MG per tablet Take 2 tablets by mouth every 4 (four) hours as needed. Patient taking differently: Take 2 tablets by mouth every 4 (four) hours as needed for moderate pain.  04/20/14  Yes Gerhard Munchobert Lockwood, MD   BP 117/85 mmHg  Pulse 65  Temp(Src) 98.1 F (36.7 C) (Oral)  Resp 22  Ht 5\' 4"  (1.626 m)  Wt 178 lb (80.74 kg)  BMI 30.54 kg/m2  SpO2 99%  LMP 04/20/2014 (Exact Date) Physical Exam  Constitutional: She appears well-developed and well-nourished. No distress.  HENT:  Head: Normocephalic and atraumatic.  Neck: Neck supple.  Cardiovascular: Normal rate and regular rhythm.   Pulmonary/Chest: Effort normal and breath sounds normal. No respiratory distress. She has no wheezes. She has no rales.  Abdominal: Soft. She exhibits no distension. There is no tenderness. There is no rebound and no guarding.  Musculoskeletal:  Left hand and wrist with active ROM, sensation intact, capillary refill < 2  seconds.   Neurological: She is alert.  Skin: She is not diaphoretic.  Psychiatric: Her speech is delayed. She is withdrawn. She exhibits a depressed mood. She expresses suicidal ideation. She expresses no homicidal ideation. She expresses suicidal plans.  Crying.  Nursing note and vitals reviewed.   ED Course  Procedures (including critical care time) Labs Review Labs Reviewed  CBC WITH DIFFERENTIAL - Abnormal; Notable for the following:    Neutrophils Relative % 38 (*)    Lymphocytes Relative 47 (*)    Eosinophils Relative 6 (*)    All other components within normal limits  COMPREHENSIVE METABOLIC PANEL - Abnormal; Notable for the following:    Glucose, Bld 100 (*)     Anion gap 17 (*)    All other components within normal limits  SALICYLATE LEVEL - Abnormal; Notable for the following:    Salicylate Lvl <2.0 (*)    All other components within normal limits  ETHANOL  ACETAMINOPHEN LEVEL  URINE RAPID DRUG SCREEN (HOSP PERFORMED)    Imaging Review No results found.   EKG Interpretation None       LACERATION REPAIR Performed by: Trixie DredgeWEST, Ryen Rhames Authorized by: Trixie DredgeWEST, Cathlin Buchan Consent: Verbal consent obtained. Risks and benefits: risks, benefits and alternatives were discussed Consent given by: patient Patient identity confirmed: provided demographic data Prepped and Draped in normal sterile fashion Wound explored  Laceration Location: left ventral wrist  Laceration Length: approximately 5 cm mostly superficial, only 1 cm with entry into dermal layer  No Foreign Bodies seen or palpated  Anesthesia: local infiltration  Local anesthetic: lidocaine 2% without epinephrine  Anesthetic total: 1 ml  Irrigation method: syringe Amount of cleaning: standard, by tech  Skin closure: 5-0 vicryl  Number of sutures: 2  Technique: simple interrupted  Patient tolerance: Patient tolerated the procedure well with no immediate complications.   MDM   Final diagnoses:  Suicidal ideation  Suicide attempt  Wrist laceration, left, initial encounter    Pt with hx depression brought in by son after second suicide attempt in two days.  During last ED visit for ammonia ingestion, pt claimed it was accidental.  Today she tells me it was on purpose in an attempt to kill herself.  She cut her left ventral wrist today also in a suicide attempt.  She is tearful, withdrawn, not talking very much or interacting much.  Appears extremely depressed.  Left wrist/hand neurovascularly intact.  Small area of mostly superficial wound deep enough to require sutures.  Pt did want wound repaired and claimed tetanus vx within last 5 years.  Denies HI.  Pt is voluntary.  Discussed pt  with TTS who will see and assess patient.      TickfawEmily Chayil Gantt, PA-C 04/22/14 [phone]  Toy CookeyMegan Docherty, MD 04/22/14 [phone]

## 2014-04-21 NOTE — ED Notes (Signed)
The patient is unable to give an urine specimen at this time. The tech has reported to the RN in charge. 

## 2014-04-21 NOTE — ED Notes (Signed)
Charge nurse and staffing office notified for pt.'s sitter , paper scrubs given to pt. to wear , security notified to wand pt.

## 2014-04-21 NOTE — ED Notes (Signed)
Pt. clothes bagged and labelled given to son at triage .

## 2014-04-21 NOTE — ED Notes (Signed)
The tech cleaned the left wrist with normal saline. The tech reported to the RN in charge.

## 2014-04-21 NOTE — ED Notes (Signed)
Pt placed into scrubs. Security called. pts belongings given to son to take home with him.

## 2014-04-22 ENCOUNTER — Inpatient Hospital Stay: Payer: Self-pay | Admitting: Psychiatry

## 2014-04-22 LAB — RAPID URINE DRUG SCREEN, HOSP PERFORMED
Amphetamines: NOT DETECTED
Barbiturates: NOT DETECTED
Benzodiazepines: NOT DETECTED
Cocaine: NOT DETECTED
Opiates: NOT DETECTED
Tetrahydrocannabinol: NOT DETECTED

## 2014-04-22 NOTE — [facility] Counselor (Signed)
Referral sent to the following facilities in effort to obtain inpt placement:   [facility]     Cyndie MullAnna Krupa Stege, [facility] Triage Specialist

## 2014-04-22 NOTE — Progress Notes (Signed)
[facility] been [facility], will call with room number after discharges.  Derrell Lollingoris Christyana Corwin, MSW  Social Worker [phone]
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
## 2014-04-22 NOTE — ED Notes (Signed)
All belongings and valuables sent with pt via [facility]

## 2014-04-22 NOTE — ED Notes (Signed)
Patient offered snack and drinks, she declines at this time

## 2014-04-22 NOTE — ED Notes (Signed)
Alexis Warren has arrived to transport. [place] is aware pt is leaving with [facility] now

## 2014-04-22 NOTE — ED Notes (Signed)
[facility] called to transport 

## 2014-04-22 NOTE — ED Notes (Signed)
telepsych box at bedside.

## 2014-04-22 NOTE — ED Notes (Signed)
Called tts and [facility]. Advised her that in the documented tts assessment the interviewer had documented the patients medical history incorrectly and could she review the documentation for validity.

## 2014-04-22 NOTE — BH Assessment (Signed)
Assessment requested.  Attempted to call Trixie DredgeEmily West, PA-C:  She advised that pt is "extremely depressed and barely talking."  She advised that pt is entirely Spanish - speaking.    Advised that this TTS Counselor would call back shortly once arrangements were made for translator.  Beryle FlockMary Echo Propp, MS, CRC, [facility] Triage Specialist [facility]

## 2014-04-22 NOTE — BH Assessment (Signed)
Assessment completed.  Spoke to The PNC FinancialSpencer Simon, [place]PA:  Pt meets IP criteria.  [facility].  TTS will seek appropriate outside placement.  Spoke to Berton Bonassandra Carlan, RN:  Advised of outcome and plan.  Told AshmoreEmily West, PA-C, not available.  Beryle FlockMary Keyla Milone, MS, CRC, [facility] Triage Specialist [facility]

## 2014-04-23 LAB — COMPREHENSIVE METABOLIC PANEL
ALBUMIN: 3.7 g/dL (ref 3.4–5.0)
ANION GAP: 10 (ref 7–16)
Alkaline Phosphatase: 75 U/L
BILIRUBIN TOTAL: 0.7 mg/dL (ref 0.2–1.0)
BUN: 14 mg/dL (ref 7–18)
CALCIUM: 8.6 mg/dL (ref 8.5–10.1)
CHLORIDE: 102 mmol/L (ref 98–107)
CO2: 22 mmol/L (ref 21–32)
Creatinine: 0.69 mg/dL (ref 0.60–1.30)
EGFR (Non-African Amer.): >60
GLUCOSE: 99 mg/dL (ref 65–99)
Osmolality: 269 (ref 275–301)
POTASSIUM: 3.9 mmol/L (ref 3.5–5.1)
SGOT(AST): 21 U/L (ref 15–37)
SGPT (ALT): 21 U/L
Sodium: 134 mmol/L — ABNORMAL LOW (ref 136–145)
TOTAL PROTEIN: 7.9 g/dL (ref 6.4–8.2)

## 2014-04-23 LAB — CBC WITH DIFFERENTIAL/PLATELET
BASOS ABS: 0 10*3/uL (ref 0.0–0.1)
Basophil %: 0.6 %
EOS ABS: 0.2 10*3/uL (ref 0.0–0.7)
Eosinophil %: 2.6 %
HCT: 39.5 % (ref 35.0–47.0)
HGB: 12.9 g/dL (ref 12.0–16.0)
Lymphocyte #: 1.6 10*3/uL (ref 1.0–3.6)
Lymphocyte %: 24 %
MCH: 30.2 pg (ref 26.0–34.0)
MCHC: 32.7 g/dL (ref 32.0–36.0)
MCV: 92 fL (ref 80–100)
Monocyte #: 0.3 x10 3/mm (ref 0.2–0.9)
Monocyte %: 4.9 %
Neutrophil #: 4.7 10*3/uL (ref 1.4–6.5)
Neutrophil %: 67.9 %
Platelet: 254 10*3/uL (ref 150–440)
RBC: 4.27 10*6/uL (ref 3.80–5.20)
RDW: 13.3 % (ref 11.5–14.5)
WBC: 6.9 10*3/uL (ref 3.6–11.0)

## 2014-04-23 LAB — TSH: Thyroid Stimulating Horm: 0.686 u[IU]/mL

## 2014-04-24 LAB — URINALYSIS, COMPLETE
BACTERIA: NONE SEEN
BILIRUBIN, UR: NEGATIVE
Glucose,UR: NEGATIVE mg/dL (ref 0–75)
Leukocyte Esterase: NEGATIVE
Nitrite: NEGATIVE
PH: 5 (ref 4.5–8.0)
Protein: NEGATIVE
SPECIFIC GRAVITY: 1.025 (ref 1.003–1.030)
WBC UR: 2 /HPF (ref 0–5)

## 2014-04-24 LAB — PREGNANCY, URINE: Pregnancy Test, Urine: NEGATIVE m[IU]/mL
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
## 2016-11-08 ENCOUNTER — Ambulatory Visit (INDEPENDENT_AMBULATORY_CARE_PROVIDER_SITE_OTHER): Payer: Self-pay | Admitting: Internal Medicine

## 2016-11-08 ENCOUNTER — Encounter: Payer: Self-pay | Admitting: Internal Medicine

## 2016-11-08 VITALS — BP 122/82 | HR 76 | Resp 12 | Ht 62.0 in | Wt 208.0 lb

## 2016-11-08 DIAGNOSIS — B351 Tinea unguium: Secondary | ICD-10-CM

## 2016-11-08 DIAGNOSIS — Z79899 Other long term (current) drug therapy: Secondary | ICD-10-CM

## 2016-11-08 DIAGNOSIS — B360 Pityriasis versicolor: Secondary | ICD-10-CM

## 2016-11-08 DIAGNOSIS — B353 Tinea pedis: Secondary | ICD-10-CM

## 2016-11-08 DIAGNOSIS — F331 Major depressive disorder, recurrent, moderate: Secondary | ICD-10-CM

## 2016-11-08 MED ORDER — FLUOXETINE HCL 20 MG PO CAPS: 20.0000 mg | ORAL_CAPSULE | Freq: Every day | ORAL | 2 refills | Status: DC

## 2016-11-08 MED ORDER — TERBINAFINE HCL 250 MG PO TABS: 250.0000 mg | ORAL_TABLET | Freq: Every day | ORAL | 0 refills | Status: DC

## 2016-11-08 NOTE — Progress Notes (Signed)
LCSWA met with Alexis Warren to complete the new patient screener. Alexis Warren's daughter was present to interpret. Alexis Warren scored high on the new phq-9. Alexis Warren reported that she had thoughts that she was better off dead. LCSWA completed a risk assessment with her. Alexis Warren reported that she has been dealing with depression for 2 year and she has been hospitalized twice. She was hospitalized once for an attempt in which she drank ammonia and another time in which she had intense ideation. Alexis Warren did not disclose any plans for suicide at the moment but has been having ideation for the past week. She mentioned that she did not have many people to talk to because she did not trust anyone. Her daughter said that she has family around who would look after her. LCSWA completed a safety plan with Alexis Warren and will check in with her on Friday. After Friday LCSWA will complete a warm hand-off with Alexis Warren to continue care.

## 2016-11-08 NOTE — Progress Notes (Signed)
   Subjective:    Patient ID: Alexis Warren, female    DOB: 03-20-1971, 46 y.o.   MRN: [id number]  HPI   Here to establish  1.  Foot fungus and toenail onychomycosis:  For 3 years.  Started with a job where she wore boots and feet always wet.   Has never been treated before.  2.  Rash in between breasts for extended period of time as well.    3.  Depression:  Hospitalized for suicide attempt in 12.2015.  Was being [facility] up until beginning of this year.  Was fired from job as took too long of a break one day and lost insurance benefits.  Did not realize she could go back to [facility] as an uninsured patient.  She thought it would cost more.  Ran out of meds in January.  Did not go to her follow up [facility] in February.   [facility]ook Fluox[facility] with [facility]:  We obtained info that is was 40 mg daily, but patient states at one point, she was up to 80 mg daily Denies suicidal ideation with me--just doesn't feel she has any worth.  Last time really felt suicidal was prior to hospitalization in 2015.  Does not have happiness as she did when she was younger.  Daughter also states she feels betrayed as has a bad relationship with husband and her children still interact with him.     No outpatient prescriptions have been marked as taking for the 11/08/16 encounter (Office Visit) with Julieanne MansonMulberry, Allianna Beaubien, MD.    No Known Allergies   Review of Systems     Objective:   Physical Exam  Tearful at times when discussing depression Lungs:  CTA CV:  RRR with normal S1 and S2, No S3, S4 or murmur, radial pulses normal and equal. Feet:  Significant flaking and peeling on plantar surfaces and edges of feet with callusing.  Toenails with discoloration, thickening, crumbling diffusely.        Assessment & Plan:  1.  Depression:  Fluoxetine 20 mg daily until seen again this Friday, then will likely increase to 40 mg if tolerating fine.   Tawnya Crookybreisha Terry, LCSWA will also make  safety plan with patient. We have spoken about how suicidal thoughts/plans should be considered a medical emergency and if she has suicidal thoughts or plans, she needs to go to ED. Daughter agrees to always have a family member or patient's supportive boyfriend with her at all times.   Patient lives with daughter. Patient is agreeable.  2.  Hold on terbinafine until she is tolerating Fluoxetine, then can add at a later date.  Discussed topical Terbinafine OTC mixed with Tea Tree oil twice daily until then to at least treat tinea pedis.  3.  Tinea versicolor:  Selsun Blue applied to area of chest when starts shower each day and wash off at end.  May also utilize the topical Terbinafine for 14 days twice daily, though the oral version not helpful with this.

## 2016-11-08 NOTE — Patient Instructions (Addendum)
For foot and toenail fungus:  Spray your shoes with Lysol or Lotrimin Antifungal spray when you start the medication for your feet.   Re-spray your the shoes you wear with each wearing and allow to dry before wearing again You will need to continue to spray the shoes you have during the infection of your toenails and feet have been thrown out due to wear.  Once your toenails and feet are clear of infection, the shoes you buy new do not necessarily need to be sprayed. Clean your shower floor once to twice daily with bleach containing cleaner.  Tea tree oil and Gold Bond foot lotion mixed and applied to feet nightly  Selsun Blue shampoo for rash on chest/trunk for tinea versicolor  Hold on starting Terbinafine until I see you back in 2 weeks. Just purchase the Fluoxetine for now

## 2016-11-09 LAB — COMPREHENSIVE METABOLIC PANEL
A/G RATIO: 1.4 (ref 1.2–2.2)
ALT: 50 IU/L — ABNORMAL HIGH (ref 0–32)
AST: 48 IU/L — AB (ref 0–40)
Albumin: 4 g/dL (ref 3.5–5.5)
Alkaline Phosphatase: 72 IU/L (ref 39–117)
BUN/Creatinine Ratio: 21 (ref 9–23)
BUN: 10 mg/dL (ref 6–24)
Bilirubin Total: 0.2 mg/dL (ref 0.0–1.2)
CHLORIDE: 106 mmol/L (ref 96–106)
CO2: 23 mmol/L (ref 20–29)
Calcium: 9 mg/dL (ref 8.7–10.2)
Creatinine, Ser: 0.48 mg/dL — ABNORMAL LOW (ref 0.57–1.00)
GFR calc non Af Amer: 118 mL/min/{1.73_m2} (ref >59)
GFR, EST AFRICAN AMERICAN: 136 mL/min/{1.73_m2} (ref >59)
GLOBULIN, TOTAL: 2.9 g/dL (ref 1.5–4.5)
Glucose: 109 mg/dL — ABNORMAL HIGH (ref 65–99)
POTASSIUM: 4.9 mmol/L (ref 3.5–5.2)
Sodium: 140 mmol/L (ref 134–144)
TOTAL PROTEIN: 6.9 g/dL (ref 6.0–8.5)

## 2016-11-11 ENCOUNTER — Other Ambulatory Visit: Payer: Self-pay | Admitting: Licensed Clinical Social Worker

## 2016-11-11 ENCOUNTER — Ambulatory Visit (INDEPENDENT_AMBULATORY_CARE_PROVIDER_SITE_OTHER): Payer: Self-pay | Admitting: Internal Medicine

## 2016-11-11 ENCOUNTER — Encounter: Payer: Self-pay | Admitting: Internal Medicine

## 2016-11-11 VITALS — BP 112/60 | HR 70 | Resp 12 | Ht 62.0 in | Wt 207.0 lb

## 2016-11-11 DIAGNOSIS — F331 Major depressive disorder, recurrent, moderate: Secondary | ICD-10-CM

## 2016-11-11 NOTE — Patient Instructions (Signed)
Go get your Fluoxetine Don't fill Terbinafine yet

## 2016-11-11 NOTE — Progress Notes (Addendum)
   Subjective:    Patient ID: Alexis Warren, female    DOB: Aug 09, 1970, 46 y.o.   MRN: [id number]  HPI   Did not go pick up her Fluoxetine and get started after being seen on 11/08/2016.  She does not have any difficulties with cost or transportation, just forgot. Denies any thoughts of harming herself or others.  More worried about getting the Terbinafine.  Discussed I had asked them to wait on getting the Terbinafine until we saw how she did on the Fluoxetine      Review of Systems     Objective:   Physical Exam Smiling, laughing and apologizing for not getting the Fluoxetine.       Assessment & Plan:  Depression:  Patient was to follow up today to be certain she was fine on restart of Fluoxetine. Has not picked up medication as above. To pick up after her visit with Tawnya Crookybreisha Terry, LCSWA today.   Will make sure she has follow up with Alexis Warren, MSW intern next Wednesday and then a follow up with both of us a week later.  Addendum:  Patient left before her appointment with Ms. Terry today.  Not clear why.  Left a note with Ms Aurther Warren that she would follow up with counseling on July 18th.   Alexis Warren will call to be sure she makes an appointment for next week and does not wait until the 18th.  Patient spoke through daughter with Alexis Warren and stated she could not come next week. I called daughter again and insisted her mother be seen by next Wednesday to make sure she is doing okay on the Fluoxetine.  She cannot miss being seen next week by LCSWA  Discussed payment can be worked out.

## 2016-11-22 ENCOUNTER — Telehealth: Payer: Self-pay | Admitting: Internal Medicine

## 2016-11-23 ENCOUNTER — Encounter: Payer: Self-pay | Admitting: Internal Medicine

## 2016-11-23 ENCOUNTER — Ambulatory Visit: Payer: Self-pay | Admitting: Licensed Clinical Social Worker

## 2016-11-23 ENCOUNTER — Ambulatory Visit: Payer: Self-pay | Admitting: Internal Medicine

## 2016-11-23 ENCOUNTER — Ambulatory Visit (INDEPENDENT_AMBULATORY_CARE_PROVIDER_SITE_OTHER): Payer: Self-pay | Admitting: Internal Medicine

## 2016-11-23 VITALS — BP 122/70 | HR 72 | Resp 12 | Ht 62.0 in | Wt 203.0 lb

## 2016-11-23 DIAGNOSIS — R748 Abnormal levels of other serum enzymes: Secondary | ICD-10-CM

## 2016-11-23 DIAGNOSIS — B351 Tinea unguium: Secondary | ICD-10-CM

## 2016-11-23 DIAGNOSIS — F331 Major depressive disorder, recurrent, moderate: Secondary | ICD-10-CM

## 2016-11-23 HISTORY — DX: Tinea unguium: B35.1

## 2016-11-23 MED ORDER — FLUOXETINE HCL 40 MG PO CAPS: ORAL_CAPSULE | ORAL | 11 refills | Status: DC

## 2016-11-23 NOTE — Progress Notes (Signed)
   Subjective:    Patient ID: Alexis Warren, female    DOB: Nov 12, 1970, 46 y.o.   MRN: [id number]  HPI   1.  Depression:  Started her Fluoxetine 40 mg starting Monday before last--so about 1.5 weeks ago.  States she did not realize she has an appt. With Karie FetchJennifer Pena last week, though called when she missed her appt. With Tawnya Crookybreisha Terry the week before and was very clear with her daughter she should not miss her appt. Last week as well.   Victorino DikeJennifer called her last week twice when she missed her appointment.   Denies suicidal ideation and feels she is doing much better.  Current Meds  Medication Sig  . FLUoxetine (PROZAC) 20 MG capsule Take 1 capsule (20 mg total) by mouth daily. In morning  Actually 2 capsules once daily   Review of Systems     Objective:   Physical Exam   NAD, smiling        Assessment & Plan:  1.  Depression:  Improved on 40 mg of Fluoxetine daily.  Changed her to 40 mg caps for cost improvement.  Follow up in 1 week.  Historically, was taking 80 mg, but will work up to that if necessary.  Has appt in a few minutes with Karie FetchJennifer Pena for counseling.  2.  Toenail onychomycosis:  Hold on Terbinafine until recheck liver enzymes in 1 month at folllow up.  Mildly elevated transaminases a couple weeks ago.

## 2016-11-25 ENCOUNTER — Ambulatory Visit (INDEPENDENT_AMBULATORY_CARE_PROVIDER_SITE_OTHER): Payer: Self-pay | Admitting: Licensed Clinical Social Worker

## 2016-11-25 DIAGNOSIS — F320 Major depressive disorder, single episode, mild: Secondary | ICD-10-CM
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
## 2016-12-09 ENCOUNTER — Other Ambulatory Visit: Payer: Self-pay | Admitting: Licensed Clinical Social Worker

## 2016-12-29 ENCOUNTER — Encounter: Payer: Self-pay | Admitting: Internal Medicine

## 2016-12-29 ENCOUNTER — Ambulatory Visit (INDEPENDENT_AMBULATORY_CARE_PROVIDER_SITE_OTHER): Payer: Self-pay | Admitting: Internal Medicine

## 2016-12-29 VITALS — BP 122/80 | HR 84 | Resp 12 | Ht 62.0 in | Wt 196.0 lb

## 2016-12-29 DIAGNOSIS — F331 Major depressive disorder, recurrent, moderate: Secondary | ICD-10-CM

## 2016-12-29 DIAGNOSIS — B351 Tinea unguium: Secondary | ICD-10-CM

## 2016-12-29 DIAGNOSIS — E7800 Pure hypercholesterolemia, unspecified: Secondary | ICD-10-CM

## 2016-12-29 DIAGNOSIS — R748 Abnormal levels of other serum enzymes: Secondary | ICD-10-CM

## 2016-12-29 DIAGNOSIS — Z79899 Other long term (current) drug therapy: Secondary | ICD-10-CM

## 2016-12-29 HISTORY — DX: Abnormal levels of other serum enzymes: R74.8

## 2016-12-29 HISTORY — DX: Pure hypercholesterolemia, unspecified: E78.00

## 2016-12-29 NOTE — Progress Notes (Signed)
   Subjective:    Patient ID: Alexis Warren, female    DOB: 05/18/1970, 46 y.o.   MRN: [id number]  HPI  1.  Depression:  Taking 40 mg daily now and tolerating fine.  Feels her depression is controlled. Has been called by Nilda Simmer, LCSW, but has not returned her call. Denies suicidal ideation.  Energy is still not good.   No problems getting out of bed and getting day started, however. Sleeping fine.  2.  Elevated liver enzymes:  Did not start Terbinafine due to mildly elevated trans aminases. No history of hepatitis.  No history of jaundice.  No blood transfusion.  No personal use of IV drugs and no intimate person who has used IV drugs.  Brother uses IV drugs, but [place] and no HIV or hepatitis she of which she is aware.  Current Meds  Medication Sig  . FLUoxetine (PROZAC) 40 MG capsule 1 capsule by mouth once daily in morning    No Known Allergies   Review of Systems     Objective:   Physical Exam NAD, good eye contact.   Lungs:  CTA CV:  RRR without murmur or rub, radial pulses normal and equal Abd:  S, NT, No HSM or mass, + BS Toenails unchanged       Assessment & Plan:  1.  Depression:  Continue on 40 mg Fluoxetine.  Encouraged calling Samul Dada, LCSW for counseling.  2. Elevated Liver enzymes:  Recheck hepatic profile.  3.  Toenail Onychomycosis:  Start Terbinafine if transaminases fine.    4.  HM:  CBC, flu vaccine at one of the September clinic.  5.  Hypercholesterolemia:  FLP

## 2016-12-29 NOTE — Patient Instructions (Signed)
Free flu vaccine at flu vaccine clinics:  September 20 8:30 to 11:30 a.m. And [facility] 1-4 p.m.  Both at [facility] next door to clinic  

## 2016-12-30 LAB — LIPID PANEL W/O CHOL/HDL RATIO
Cholesterol, Total: 208 mg/dL — ABNORMAL HIGH (ref 100–199)
HDL: 49 mg/dL (ref >39)
LDL CALC: 130 mg/dL — AB (ref 0–99)
TRIGLYCERIDES: 144 mg/dL (ref 0–149)
VLDL Cholesterol Cal: 29 mg/dL (ref 5–40)

## 2016-12-30 LAB — CBC WITH DIFFERENTIAL/PLATELET

## 2016-12-30 LAB — HEPATIC FUNCTION PANEL
ALBUMIN: 4 g/dL (ref 3.5–5.5)
ALT: 28 IU/L (ref 0–32)
AST: 33 IU/L (ref 0–40)
Alkaline Phosphatase: 77 IU/L (ref 39–117)
Bilirubin Total: 0.4 mg/dL (ref 0.0–1.2)
Bilirubin, Direct: 0.1 mg/dL (ref 0.00–0.40)
Total Protein: 6.9 g/dL (ref 6.0–8.5)

## 2017-01-02 ENCOUNTER — Other Ambulatory Visit: Payer: Self-pay | Admitting: Licensed Clinical Social Worker

## 2017-02-13 ENCOUNTER — Other Ambulatory Visit (INDEPENDENT_AMBULATORY_CARE_PROVIDER_SITE_OTHER): Payer: Self-pay

## 2017-02-13 DIAGNOSIS — R748 Abnormal levels of other serum enzymes: Secondary | ICD-10-CM

## 2017-02-13 DIAGNOSIS — Z79899 Other long term (current) drug therapy: Secondary | ICD-10-CM

## 2017-02-14 LAB — CBC WITH DIFFERENTIAL/PLATELET
BASOS ABS: 0 10*3/uL (ref 0.0–0.2)
Basos: 1 %
EOS (ABSOLUTE): 0.3 10*3/uL (ref 0.0–0.4)
Eos: 4 %
Hematocrit: 31.8 % — ABNORMAL LOW (ref 34.0–46.6)
Hemoglobin: 9.6 g/dL — ABNORMAL LOW (ref 11.1–15.9)
Immature Grans (Abs): 0 10*3/uL (ref 0.0–0.1)
Immature Granulocytes: 0 %
LYMPHS: 38 %
Lymphocytes Absolute: 2.2 10*3/uL (ref 0.7–3.1)
MCH: 24.4 pg — AB (ref 26.6–33.0)
MCHC: 30.2 g/dL — AB (ref 31.5–35.7)
MCV: 81 fL (ref 79–97)
MONOCYTES: 8 %
Monocytes Absolute: 0.5 10*3/uL (ref 0.1–0.9)
NEUTROS PCT: 49 %
Neutrophils Absolute: 2.8 10*3/uL (ref 1.4–7.0)
Platelets: 316 10*3/uL (ref 150–379)
RBC: 3.93 x10E6/uL (ref 3.77–5.28)
RDW: 18 % — ABNORMAL HIGH (ref 12.3–15.4)
WBC: 5.7 10*3/uL (ref 3.4–10.8)

## 2017-02-14 LAB — HEPATIC FUNCTION PANEL
ALT: 19 IU/L (ref 0–32)
AST: 24 IU/L (ref 0–40)
Albumin: 3.8 g/dL (ref 3.5–5.5)
Alkaline Phosphatase: 75 IU/L (ref 39–117)
Bilirubin Total: <0.2 mg/dL (ref 0.0–1.2)
Bilirubin, Direct: 0.05 mg/dL (ref 0.00–0.40)
TOTAL PROTEIN: 6.6 g/dL (ref 6.0–8.5)

## 2017-02-16 ENCOUNTER — Encounter: Payer: Self-pay | Admitting: Internal Medicine

## 2017-02-16 ENCOUNTER — Ambulatory Visit (INDEPENDENT_AMBULATORY_CARE_PROVIDER_SITE_OTHER): Payer: Self-pay | Admitting: Internal Medicine

## 2017-02-16 VITALS — BP 122/78 | HR 78 | Resp 12 | Ht 62.0 in | Wt 201.0 lb

## 2017-02-16 DIAGNOSIS — E669 Obesity, unspecified: Secondary | ICD-10-CM

## 2017-02-16 DIAGNOSIS — D649 Anemia, unspecified: Secondary | ICD-10-CM | POA: Insufficient documentation

## 2017-02-16 DIAGNOSIS — F331 Major depressive disorder, recurrent, moderate: Secondary | ICD-10-CM

## 2017-02-16 DIAGNOSIS — R739 Hyperglycemia, unspecified: Secondary | ICD-10-CM

## 2017-02-16 DIAGNOSIS — Z6836 Body mass index (BMI) 36.0-36.9, adult: Secondary | ICD-10-CM

## 2017-02-16 DIAGNOSIS — E6609 Other obesity due to excess calories: Secondary | ICD-10-CM

## 2017-02-16 HISTORY — DX: Obesity, unspecified: E66.9

## 2017-02-16 HISTORY — DX: Hyperglycemia, unspecified: R73.9

## 2017-02-16 NOTE — Progress Notes (Signed)
   Subjective:    Patient ID: Alexis Warren, female    DOB: [birth year], 46 y.o.   MRN: [id number]  HPI   Here for follow up of labs done 3 days ago.  1.  Anemia, normocytic:  Has never been told she has anemia and previously normal in 2015.   Does have heavy periods with clots, the first 3 days.  Has been a problem for the past year.  Has been having hot flashes and night sweats intermittently for past year as well . No abdominal pain outside of her period.   No hematochezia or melena.   Good appetite.  2.  Toenail onychomycosis:  Did not start the Terbinafine after instructed to do so when her liver enzymes normalized.  States she was told by pharmacy that the prescription was too old.  Not clear if she tried to contact clinic about this.  [facility] states she just never picked up.  3.  Depression:  [facility] states patient has 2 profiles in their records.  She has never filled the prescription written by this clinic for Fluoxetine.  Recently had other Fluoxetine and also Trazodone and apparently Doxepin transferred [facility] in [place].  Patient states her boyfriend lives in [place], so she spends a lot of time there, but has her home in [place]. She is taking the Fluoxetine 40 mg daily and feels she is doing well.  She is interested in counseling with one of our Spanish speaking interns or LCSW.  Current Meds  Medication Sig  . FLUoxetine (PROZAC) 40 MG capsule 1 capsule by mouth once daily in morning    No Known Allergies  Review of Systems     Objective:   Physical Exam  NAD Lungs:  CTA CV: RRR without murmur or rub Abd:  S, NT, No HSM or mass, + BS LE:  No edema        Assessment & Plan:  1.  Normocytic anemia:  Guaiac stool packet given, to return in 2 weeks.  Iron studies, Vitamin B12, Folic acid.  2.  Toenail Onychomycosis:  Price for Terbinafine now above $50 at [facility].  She does not have an orange card as of yet.   She will get the orange card  before next visit, but prefers to fill the Rx for Terbinafine now at [facility]. She will need to call to get it filled. Discussed she needs to cancel her other profile with [facility] as this is confusing.  3.  Depression:  Doing well.  Would like to have counseling--missed her list of no shows and cancellations with LCSW, however.  Samul Dada, LCSW will call again and see if she is willing to work on this or not. Continue Fluoxetine 40 mg daily.

## 2017-02-16 NOTE — Patient Instructions (Signed)
Tome un vaso de agua antes de cada comida Tome un minimo de 6 a 8 vasos de agua diarios Coma tres veces al dia Coma una proteina y una grasa saludable con comida.  (huevos, pescado, pollo, pavo, y limite carnes rojas Coma 5 porciones diarias de legumbres.  Mezcle los colores Coma 2 porciones diarias de frutas con cascara cuando sea comestible Use platos pequeos Suelte su tenedor o cuchara despues de cada mordida hata que se mastique y se trague Come en la mesa con amigos o familiares por lo menos una vez al dia Apague la televisin y aparatos electrnicos durante la comida  Su objetivo debe ser perder una libra por semana  For foot and toenail fungus:  Spray your shoes with Lysol or Lotrimin Antifungal spray when you start the medication for your feet.   Re-spray your the shoes you wear with each wearing and allow to dry before wearing again You will need to continue to spray the shoes you have during the infection of your toenails and feet have been thrown out due to wear.  Once your toenails and feet are clear of infection, the shoes you buy new do not necessarily need to be sprayed. Clean your shower floor once to twice daily with bleach containing cleaner. 

## 2017-02-17 LAB — VITAMIN B12: VITAMIN B 12: 493 pg/mL (ref 232–1245)

## 2017-02-17 LAB — IRON AND TIBC
IRON SATURATION: 8 % — AB (ref 15–55)
Iron: 27 ug/dL (ref 27–159)
Total Iron Binding Capacity: 344 ug/dL (ref 250–450)
UIBC: 317 ug/dL (ref 131–425)

## 2017-02-17 LAB — FOLATE: FOLATE: 15 ng/mL (ref >3.0)

## 2017-02-17 LAB — HGB A1C W/O EAG: Hgb A1c MFr Bld: 6.3 % — ABNORMAL HIGH (ref 4.8–5.6)

## 2017-03-02 ENCOUNTER — Encounter: Payer: Self-pay | Admitting: Internal Medicine

## 2017-03-06 ENCOUNTER — Other Ambulatory Visit (INDEPENDENT_AMBULATORY_CARE_PROVIDER_SITE_OTHER): Payer: Self-pay

## 2017-03-06 DIAGNOSIS — Z1211 Encounter for screening for malignant neoplasm of colon: Secondary | ICD-10-CM

## 2017-03-06 LAB — POC HEMOCCULT BLD/STL (HOME/3-CARD/SCREEN)
Card #3 Fecal Occult Blood, POC: NEGATIVE
FECAL OCCULT BLD: NEGATIVE
Fecal Occult Blood, POC: NEGATIVE

## 2017-04-03 ENCOUNTER — Other Ambulatory Visit: Payer: Self-pay

## 2017-04-06 ENCOUNTER — Ambulatory Visit: Payer: Self-pay | Admitting: Internal Medicine

## 2017-04-14 ENCOUNTER — Other Ambulatory Visit: Payer: Self-pay

## 2017-04-14 DIAGNOSIS — D649 Anemia, unspecified: Secondary | ICD-10-CM

## 2017-04-14 DIAGNOSIS — Z79899 Other long term (current) drug therapy: Secondary | ICD-10-CM

## 2017-04-15 LAB — HEPATIC FUNCTION PANEL
ALT: 18 IU/L (ref 0–32)
AST: 17 IU/L (ref 0–40)
Albumin: 4 g/dL (ref 3.5–5.5)
Alkaline Phosphatase: 84 IU/L (ref 39–117)
BILIRUBIN, DIRECT: 0.07 mg/dL (ref 0.00–0.40)
Total Protein: 6.9 g/dL (ref 6.0–8.5)

## 2017-04-15 LAB — CBC WITH DIFFERENTIAL/PLATELET
BASOS ABS: 0 10*3/uL (ref 0.0–0.2)
Basos: 0 %
EOS (ABSOLUTE): 0.2 10*3/uL (ref 0.0–0.4)
Eos: 3 %
Hematocrit: 35.3 % (ref 34.0–46.6)
Hemoglobin: 11.6 g/dL (ref 11.1–15.9)
Immature Grans (Abs): 0 10*3/uL (ref 0.0–0.1)
Immature Granulocytes: 0 %
LYMPHS ABS: 2.4 10*3/uL (ref 0.7–3.1)
Lymphs: 26 %
MCH: 27.6 pg (ref 26.6–33.0)
MCHC: 32.9 g/dL (ref 31.5–35.7)
MCV: 84 fL (ref 79–97)
Monocytes Absolute: 0.6 10*3/uL (ref 0.1–0.9)
Monocytes: 6 %
NEUTROS ABS: 5.9 10*3/uL (ref 1.4–7.0)
Neutrophils: 65 %
PLATELETS: 275 10*3/uL (ref 150–379)
RBC: 4.2 x10E6/uL (ref 3.77–5.28)
RDW: 19.9 % — AB (ref 12.3–15.4)
WBC: 9.1 10*3/uL (ref 3.4–10.8)

## 2017-04-21 ENCOUNTER — Encounter: Payer: Self-pay | Admitting: Internal Medicine

## 2017-04-21 ENCOUNTER — Ambulatory Visit: Payer: Self-pay | Admitting: Internal Medicine

## 2017-04-21 VITALS — BP 130/80 | HR 80 | Resp 14 | Ht 62.0 in | Wt 197.0 lb

## 2017-04-21 DIAGNOSIS — D509 Iron deficiency anemia, unspecified: Secondary | ICD-10-CM

## 2017-04-21 DIAGNOSIS — Z6836 Body mass index (BMI) 36.0-36.9, adult: Secondary | ICD-10-CM

## 2017-04-21 DIAGNOSIS — D649 Anemia, unspecified: Secondary | ICD-10-CM

## 2017-04-21 DIAGNOSIS — R7303 Prediabetes: Secondary | ICD-10-CM

## 2017-04-21 DIAGNOSIS — E6609 Other obesity due to excess calories: Secondary | ICD-10-CM

## 2017-04-21 DIAGNOSIS — B351 Tinea unguium: Secondary | ICD-10-CM

## 2017-04-21 DIAGNOSIS — B353 Tinea pedis: Secondary | ICD-10-CM

## 2017-04-21 DIAGNOSIS — F331 Major depressive disorder, recurrent, moderate: Secondary | ICD-10-CM

## 2017-04-21 NOTE — Progress Notes (Signed)
   Subjective:    Patient ID: Alexis Warren, female    DOB: Jul 20, 1970, 46 y.o.   MRN: [id number]  HPI   1.  Iron Deficiency Anemia:  Hemoglobin up to 11.6 now, much better with Ferrous gluconate.  Intermittently with heavy periods.  2.  Toenail onychomycosis:  Has been taking Terbinafine daily now for about 6 weeks.  Started in mid October.  Treating shoes and shower floor as recommended per patient.  No change in toenails per patient initially, but later notes her itchy flaking feet and toenail are clear/clearing.  3.  Depression:  Taking her Fluoxetine 40 mg daily.  Feels well. Has not met with Macie Burows, LCSW--missed appointment as did not have transportation and then sounds like just forgot.  Discussed recommendation to work on issues that may be leading to her depression, not just taking a medication. No suicidal ideation. Others around her have noted she is doing better as well.  4.  Prediabetes/Obesity:  Has been working on diet.  Has cut out sodas and other carbs.  Trying to become more active.  Has lost 4 lbs since October, despite intervening holiday eating. A1C in October was 6.3%.  Discussed this is near diabetic range and lifestyle changes continue to be what is needed to avoid development of diabetes.  Current Meds  Medication Sig  . FLUoxetine (PROZAC) 40 MG capsule 1 capsule by mouth once daily in morning  . Multiple Vitamin (MULTIVITAMIN) tablet Take 1 tablet by mouth daily.  Marland Kitchen terbinafine (LAMISIL) 250 MG tablet Take 1 tablet (250 mg total) by mouth daily.      Review of Systems     Objective:   Physical Exam  NAD Feet:  No flaking or erythema of skin.  Toenails painted, but obvious normal nail at base below where paint is throughout.          Assessment & Plan:  1.  Anemia:  Much improved--out of anemia range.  To continue with Ferrous gluconate once daily.  2.  Toenail onychomycosis and tinea pedis:  Improved really after 8 weeks of oral  Terbinafine.  Finish 90 day course and follow up.  Discussed liver enzymes normal.  3.  Prediabetes/obesity:  Continued work on lifestyle changes.  Check A1C next visit if weight continues down.  4.  Depression:  Macie Burows, LCSW notified patient is open to counseling again--she will call and arrange follow up counseling.  Patient agrees.  Continue Fluoxetine.  5.  HM:  Did not have influenza available today.  To call next week to get scheduled.

## 2017-04-21 NOTE — Patient Instructions (Signed)
Habla clinica en una semana para vacuna de influenza

## 2017-05-04 ENCOUNTER — Ambulatory Visit: Payer: Self-pay | Admitting: Licensed Clinical Social Worker

## 2017-05-23 ENCOUNTER — Other Ambulatory Visit: Payer: Self-pay

## 2017-05-25 ENCOUNTER — Ambulatory Visit: Payer: Self-pay | Admitting: Internal Medicine

## 2017-05-25 ENCOUNTER — Encounter: Payer: Self-pay | Admitting: Internal Medicine

## 2017-05-25 VITALS — BP 122/82 | HR 72 | Resp 12 | Ht 62.0 in | Wt 200.0 lb

## 2017-05-25 DIAGNOSIS — R7303 Prediabetes: Secondary | ICD-10-CM

## 2017-05-25 DIAGNOSIS — E6609 Other obesity due to excess calories: Secondary | ICD-10-CM

## 2017-05-25 DIAGNOSIS — B351 Tinea unguium: Secondary | ICD-10-CM

## 2017-05-25 DIAGNOSIS — Z79899 Other long term (current) drug therapy: Secondary | ICD-10-CM

## 2017-05-25 DIAGNOSIS — Z6836 Body mass index (BMI) 36.0-36.9, adult: Secondary | ICD-10-CM

## 2017-05-25 NOTE — Progress Notes (Signed)
   Subjective:    Patient ID: Alexis Warren, female    DOB: 02-15-1971, 47 y.o.   MRN: [id number]  HPI   Toenails much improved on Terbinafine.  Still has 15 days left, for some reason.  States she has not missed.  Current Meds  Medication Sig  . FLUoxetine (PROZAC) 40 MG capsule 1 capsule by mouth once daily in morning  . IRON PO Take 65 mg by mouth. 2 daily  . terbinafine (LAMISIL) 250 MG tablet Take 1 tablet (250 mg total) by mouth daily.   No Known Allergies   Review of Systems     Objective:   Physical Exam  Bilateral great toenails with almost complete resolution of nail thickening, crumbling and discoloration.  Left a bit more involved with crumbling at distal edge than right.  Skin of feet healthy      Assessment & Plan:  1.  Toenail onychomycosis:  Resolving nicely.  Hepatic profile  2.  Obesity and Prediabetes:  Has gained a bit of weight over the holidays.  Discussed making goals and will recheck in 3 months with A1C then instead of today.

## 2017-05-26 LAB — HEPATIC FUNCTION PANEL
ALBUMIN: 4.1 g/dL (ref 3.5–5.5)
ALT: 21 IU/L (ref 0–32)
AST: 22 IU/L (ref 0–40)
Alkaline Phosphatase: 73 IU/L (ref 39–117)
Bilirubin, Direct: 0.07 mg/dL (ref 0.00–0.40)
Total Protein: 7 g/dL (ref 6.0–8.5)

## 2017-06-19 ENCOUNTER — Other Ambulatory Visit: Payer: Self-pay | Admitting: Internal Medicine

## 2017-06-19 NOTE — Telephone Encounter (Signed)
To Dr. Mulberry for approval 

## 2017-06-21 NOTE — Telephone Encounter (Signed)
Please discuss with me during clinic hours.   Need to call and find out why she wants a refill.  She was completing her 90 day treatment in January when seen.

## 2017-06-23 NOTE — Telephone Encounter (Signed)
Alexis Warren please call patient to get information on why she requested a refill. This medication is normally a one time thing.

## 2017-06-23 NOTE — Telephone Encounter (Signed)
Britt Boozericky called with no answer at all numbers available in the patient's chart. LVM for patient to return call.

## 2017-06-30 NOTE — Telephone Encounter (Signed)
Need to know why she is requesting.  She has not returned calls.  Closing note for now.

## 2017-08-24 ENCOUNTER — Ambulatory Visit: Payer: Self-pay | Admitting: Internal Medicine

## 2018-05-09 DIAGNOSIS — E119 Type 2 diabetes mellitus without complications: Secondary | ICD-10-CM | POA: Insufficient documentation

## 2018-05-09 HISTORY — DX: Type 2 diabetes mellitus without complications: E11.9

## 2018-09-10 NOTE — Telephone Encounter (Signed)
Error

## 2018-12-03 ENCOUNTER — Encounter: Payer: Self-pay | Admitting: Internal Medicine

## 2018-12-03 ENCOUNTER — Other Ambulatory Visit: Payer: Self-pay

## 2018-12-03 ENCOUNTER — Ambulatory Visit: Payer: Self-pay | Admitting: Internal Medicine

## 2018-12-03 VITALS — BP 112/80 | HR 82 | Resp 12 | Ht 62.0 in | Wt 208.0 lb

## 2018-12-03 DIAGNOSIS — R7303 Prediabetes: Secondary | ICD-10-CM

## 2018-12-03 DIAGNOSIS — Z6836 Body mass index (BMI) 36.0-36.9, adult: Secondary | ICD-10-CM

## 2018-12-03 DIAGNOSIS — E6609 Other obesity due to excess calories: Secondary | ICD-10-CM

## 2018-12-03 NOTE — Progress Notes (Signed)
    Subjective:    Patient ID: Alexis Warren, female   DOB: [birth year], 48 y.o.   MRN: [id number]   HPI   Has not been seen in over 1 year.  1.  Prediabetes:  Breakfast:   Around 9 a.m.:  buttered eggs, cheese, white flour tortilla.  Drinks water Noon:  Melons, orange, banana, stawberries.  Cherries. Describes about 1 cup of fruit. 2 p.m.:  More fruit.  Large popsicle. 5 p.m.:  White rice, refried beans, potatoes--sauteed meat with vegetables. Only drinks water. Drinks soda during the weekend.   Eats a lot of ice cream with her granddaughter  She is having symptoms of menopause. She works 6 a.m. to 4 p.m. at [employer].   Bedtime no later than 11 p.m.    No outpatient medications have been marked as taking for the 12/03/18 encounter (Office Visit) with Mack Hook, MD.   No Known Allergies   Review of Systems    Objective:   BP 112/80 (BP Location: Left Arm, Patient Position: Sitting, Cuff Size: Normal)   Pulse 82   Resp 12   Ht 5\' 2"  (1.575 m)   Wt 208 lb (94.3 kg)   LMP 11/19/2018   BMI 38.04 kg/m   Physical Exam  NAD Lungs:  CTA CV:  RRR without murmur or rub. LE:  No edema   Assessment & Plan   1.  Prediabetes with obesity:  Discussed small weekly goals for dietary and physical activity improvement. A1C today. Face to face for 40 minutes discussing lifestyle changes

## 2018-12-03 NOTE — Patient Instructions (Signed)

## 2018-12-04 LAB — HGB A1C W/O EAG: Hgb A1c MFr Bld: 6.5 % — ABNORMAL HIGH (ref 4.8–5.6)

## 2018-12-11 ENCOUNTER — Other Ambulatory Visit: Payer: Self-pay

## 2018-12-11 ENCOUNTER — Telehealth: Payer: Self-pay | Admitting: Internal Medicine

## 2018-12-11 MED ORDER — METFORMIN HCL 500 MG PO TABS: 500.0000 mg | ORAL_TABLET | Freq: Two times a day (BID) | ORAL | 11 refills | Status: DC

## 2018-12-11 NOTE — Telephone Encounter (Signed)
Pt. Called to inquire about her Metformin meds and Diabetic equipment--please refer to lab notes on 12/03/2018.  Cherice to check with Dr. Amil Amen about and affordable Glucometer from [place] [facility] so the Rx can be sent in.  Pt. Requested to be contacted after medications and equipment are sent in

## 2018-12-11 NOTE — Telephone Encounter (Signed)
Rx sent to [facility] for Metformin. Please notify patient that per Dr. Amil Amen she will need to look around and find a low cost glucometer due to her not having an orange card or insurance.  To Antony Madura to notify patient.

## 2018-12-12 NOTE — Telephone Encounter (Signed)
Called patient; vm not set up yet. Will call again later today.

## 2018-12-14 NOTE — Telephone Encounter (Signed)
Called again today; vm still not set up.

## 2018-12-14 NOTE — Telephone Encounter (Signed)
Patient returned call and information was given. Patient verbalized understanding.

## 2019-01-15 ENCOUNTER — Ambulatory Visit (HOSPITAL_COMMUNITY)
Admission: EM | Admit: 2019-01-15 | Discharge: 2019-01-15 | Disposition: A | Discharge status: Home / Self Care | Payer: Self-pay | Attending: Family Medicine | Admitting: Family Medicine

## 2019-01-15 ENCOUNTER — Ambulatory Visit (INDEPENDENT_AMBULATORY_CARE_PROVIDER_SITE_OTHER): Payer: Self-pay

## 2019-01-15 ENCOUNTER — Encounter (HOSPITAL_COMMUNITY): Payer: Self-pay

## 2019-01-15 ENCOUNTER — Other Ambulatory Visit: Payer: Self-pay

## 2019-01-15 DIAGNOSIS — W19XXXA Unspecified fall, initial encounter: Secondary | ICD-10-CM

## 2019-01-15 DIAGNOSIS — S6991XA Unspecified injury of right wrist, hand and finger(s), initial encounter: Secondary | ICD-10-CM

## 2019-01-15 DIAGNOSIS — S62306A Unspecified fracture of fifth metacarpal bone, right hand, initial encounter for closed fracture: Secondary | ICD-10-CM

## 2019-01-15 NOTE — ED Triage Notes (Signed)
Pt states she has fell and landed on her right hand causing swelling and pain. Pt states this happened yesterday.

## 2019-01-15 NOTE — Progress Notes (Signed)
Orthopedic Tech Progress Note Patient Details:  Laiya Wisby 1970-08-10 [id number]  Ortho Devices Type of Ortho Device: Ulna gutter splint, Arm sling Ortho Device/Splint Location: URE Ortho Device/Splint Interventions: Adjustment, Application, Ordered   Post Interventions Patient Tolerated: Well Instructions Provided: Care of device, Adjustment of device   Janit Pagan 01/15/2019, 2:36 PM

## 2019-01-15 NOTE — Discharge Instructions (Signed)
You have a fracture of the hand We are placing you in a splint You need to rest, elevate the hand and place ice on the area 10 to 15 minutes multiple times a day. You can take ibuprofen every 8 hours as needed for pain and inflammation and swelling. Call the orthopedic specialist tomorrow for follow-up  Tienes una fractura en la mano Te estamos poniendo una frula Debe descansar, levantar la mano y colocar hielo en el rea de 10 a 15 minutos varias veces al da. Puede tomar ibuprofeno cada 8 horas segn sea necesario para el dolor, la inflamacin y la hinchazn. Llame al especialista en ortopedia maana para un seguimiento.

## 2019-01-15 NOTE — ED Provider Notes (Signed)
[facility]    CSN: [id number] Arrival date & time: 01/15/19  1259      History   Chief Complaint Chief Complaint  Patient presents with  . Fall    HPI Alexis Warren is a 48 y.o. female.   Pt is a 48 year old female with PMH of depression, diabetes, high cholesterol.  She is presenting today with injury to the right hand.  There is pain and swelling.  This occurred yesterday when she reports she fell backwards landing on the right hand.  Symptoms have been constant and worsening.  She has been putting ice on the hand.  She has not taken anything for pain.  Denies any numbness or tingling.  ROS per HPI      Past Medical History:  Diagnosis Date  . Depression    admission 2011  . Diabetes mellitus without complication (HCC)   . Elevated liver enzymes 12/29/2016  . Hypercholesterolemia 12/29/2016  . Hyperglycemia 02/16/2017  . Obesity 02/16/2017  . Onychomycosis of toenail 11/23/2016    Patient Active Problem List   Diagnosis Date Noted  . Hyperglycemia 02/16/2017  . Obesity 02/16/2017  . Anemia 02/16/2017  . Hypercholesterolemia 12/29/2016  . Onychomycosis of toenail 11/23/2016  . Major depression 11/12/2009    Past Surgical History:  Procedure Laterality Date  . APPENDECTOMY    . CHOLECYSTECTOMY  09/11/2011   Procedure: LAPAROSCOPIC CHOLECYSTECTOMY WITH INTRAOPERATIVE CHOLANGIOGRAM;  Surgeon: Wilmon ArmsMatthew K. Corliss Skainssuei, MD;  Location: [facility] OR;  Service: General;  Laterality: N/A;  . TUBAL LIGATION     in the 90s    OB History   No obstetric history on file.      Home Medications    Prior to Admission medications   Medication Sig Start Date End Date Taking? Authorizing Provider  FLUoxetine (PROZAC) 40 MG capsule 1 capsule by mouth once daily in morning Patient not taking: Reported on 12/03/2018 11/23/16   Julieanne MansonMulberry, Elizabeth, MD  IRON PO Take 65 mg by mouth. 2 daily    [provider]  metFORMIN (GLUCOPHAGE) 500 MG tablet Take 1 tablet  (500 mg total) by mouth 2 (two) times daily with a meal. 12/11/18   Julieanne MansonMulberry, Elizabeth, MD  Multiple Vitamin (MULTIVITAMIN) tablet Take 1 tablet by mouth daily.    [provider]  terbinafine (LAMISIL) 250 MG tablet Take 1 tablet (250 mg total) by mouth daily. Patient not taking: Reported on 12/03/2018 11/08/16   Julieanne MansonMulberry, Elizabeth, MD    Family History Family History  Problem Relation Age of Onset  . Heart disease Father   . Breast cancer Other        GM < 50   . Coronary artery disease Other        F MI? age 4760s  . Cancer Maternal Grandmother        breast  . Diabetes Neg Hx   . Colon cancer Neg Hx     Social History Social History   Tobacco Use  . Smoking status: Former Smoker    Types: Cigarettes    Quit date: 04/21/2014    Years since quitting: 4.7  . Smokeless tobacco: Never Used  Substance Use Topics  . Alcohol use: No  . Drug use: No     Allergies   Patient has no known allergies.   Review of Systems Review of Systems   Physical Exam Triage Vital Signs ED Triage Vitals  Enc Vitals Group     BP 01/15/19 1339 109/66  Pulse Rate 01/15/19 1339 71     Resp 01/15/19 1339 18     Temp 01/15/19 1339 97.8 F (36.6 C)     Temp src --      SpO2 01/15/19 1339 100 %     Weight 01/15/19 1337 200 lb (90.7 kg)     Height --      Head Circumference --      Peak Flow --      Pain Score 01/15/19 1336 8     Pain Loc --      Pain Edu? --      Excl. in GC? --    No data found.  Updated Vital Signs BP 109/66   Pulse 71   Temp 97.8 F (36.6 C)   Resp 18   Wt 200 lb (90.7 kg)   LMP 11/19/2018 Comment: denies pregnancy  SpO2 100%   BMI 36.58 kg/m   Visual Acuity Right Eye Distance:   Left Eye Distance:   Bilateral Distance:    Right Eye Near:   Left Eye Near:    Bilateral Near:     Physical Exam Vitals signs and nursing note reviewed.  Constitutional:      General: She is not in acute distress.    Appearance: Normal appearance. She is  not ill-appearing, toxic-appearing or diaphoretic.  HENT:     Head: Normocephalic.     Nose: Nose normal.     Mouth/Throat:     Pharynx: Oropharynx is clear.  Eyes:     Conjunctiva/sclera: Conjunctivae normal.  Neck:     Musculoskeletal: Normal range of motion.  Pulmonary:     Effort: Pulmonary effort is normal.  Musculoskeletal: Normal range of motion.        General: Swelling, tenderness and signs of injury present.     Comments: Moderate swelling, bruising and tenderness to the dorsum of hand near the fourth and fifth metacarpals. Able to wiggle fingers. Pulse 2+ No open wounds    Skin:    General: Skin is warm and dry.     Capillary Refill: Capillary refill takes less than 2 seconds.     Findings: No rash.  Neurological:     Mental Status: She is alert.     Sensory: No sensory deficit.  Psychiatric:        Mood and Affect: Mood normal.      UC Treatments / Results  Labs (all labs ordered are listed, but only abnormal results are displayed) Labs Reviewed - No data to display  EKG   Radiology Dg Hand Complete Right  Result Date: 01/15/2019 CLINICAL DATA:  48 year old who fell yesterday and injured the RIGHT hand. Initial encounter. EXAM: RIGHT HAND - COMPLETE 3+ VIEW COMPARISON:  None. FINDINGS: Mildly displaced oblique fracture involving the mid and distal fifth metacarpal with volar angulation of the distal fragment. No fractures elsewhere. Mild narrowing of the IP joint spaces of the fingers. MCP joint spaces well-preserved. Bone mineral density well-preserved. IMPRESSION: Acute traumatic mildly displaced oblique fracture involving the mid and distal fifth metacarpal with volar angulation of the distal fragment. Electronically Signed   By: Hulan Saas M.D.   On: 01/15/2019 14:16    Procedures Procedures (including critical care time)  Medications Ordered in UC Medications - No data to display  Initial Impression / Assessment and Plan / UC Course  I have  reviewed the triage vital signs and the nursing notes.  Pertinent labs & imaging results that were available during my  care of the patient were reviewed by me and considered in my medical decision making (see chart for details).    Metacarpal fracture - 48 year old female presenting with right hand injury.  X-ray revealed acute traumatic mildly displaced oblique fracture involving the mid and distal fifth metacarpal and volar angulation of the distal fragment. Will place patient in a ulnar gutter splint and have her follow-up with orthopedic specialist tomorrow. RICE.  Final Clinical Impressions(s) / UC Diagnoses   Final diagnoses:  Closed displaced fracture of fifth metacarpal bone of right hand, unspecified portion of metacarpal, initial encounter     Discharge Instructions     You have a fracture of the hand We are placing you in a splint You need to rest, elevate the hand and place ice on the area 10 to 15 minutes multiple times a day. You can take ibuprofen every 8 hours as needed for pain and inflammation and swelling. Call the orthopedic specialist tomorrow for follow-up  Tienes una fractura en la mano Te estamos poniendo una frula Debe descansar, levantar la mano y colocar hielo en el rea de 10 a 15 minutos varias veces al da. Puede tomar ibuprofeno cada 8 horas segn sea necesario para el dolor, la inflamacin y la hinchazn. Llame al especialista en ortopedia maana para un seguimiento.    ED Prescriptions    None     Controlled Substance Prescriptions [place] Controlled Substance Registry consulted? Not Applicable   Orvan July, NP 01/15/19 1528

## 2019-03-06 ENCOUNTER — Other Ambulatory Visit: Payer: Self-pay

## 2019-03-06 ENCOUNTER — Ambulatory Visit: Payer: Self-pay | Admitting: Internal Medicine

## 2019-03-06 ENCOUNTER — Encounter: Payer: Self-pay | Admitting: Internal Medicine

## 2019-03-06 VITALS — BP 122/72 | HR 72 | Resp 12 | Ht 62.0 in | Wt 207.0 lb

## 2019-03-06 DIAGNOSIS — R7303 Prediabetes: Secondary | ICD-10-CM

## 2019-03-06 DIAGNOSIS — E119 Type 2 diabetes mellitus without complications: Secondary | ICD-10-CM

## 2019-03-06 DIAGNOSIS — Z23 Encounter for immunization: Secondary | ICD-10-CM

## 2019-03-06 LAB — GLUCOSE, POCT (MANUAL RESULT ENTRY): POC Glucose: 148 mg/dl — AB (ref 70–99)

## 2019-03-06 NOTE — Progress Notes (Signed)
    Subjective:    Patient ID: Alexis Warren, female   DOB: 1970/12/13, 48 y.o.   MRN: [id number]   HPI   1.  Diabetes:  Last A1C back in July was 6.5%, giving her the diagnosis of DM.   Started Metformin 500 mg twice daily in August.  Not using her glucometer.  Never obtained orange card and just forgot about using the glucometer. She is not sure how to use the glucometer. States she is eating more vegetables and fruit and has decreased carbs significantly.   Not very physically active since broke finger.  She now has cast off and can get back to it.  Does not yet check feet nightly when goes to bed.  2.  Mild hypercholesterolemia:  Has not been checked since well before carried new diagnosis of DM. Had eye exam 2 weeks ago in [place].  She states she is far sighted, but no diabetic damage, though she did not share she has the diagnosis.  Current Meds  Medication Sig  . metFORMIN (GLUCOPHAGE) 500 MG tablet Take 1 tablet (500 mg total) by mouth 2 (two) times daily with a meal.   No Known Allergies   Review of Systems    Objective:   BP 122/72 (BP Location: Left Arm, Patient Position: Sitting, Cuff Size: Normal)   Pulse 72   Resp 12   Ht 5\' 2"  (1.575 m)   Wt 207 lb (93.9 kg)   LMP 02/23/2019   BMI 37.86 kg/m   Physical Exam  NAD HEENT;  PERRL, EOMI, discs sharp Neck:  Supple, No adenopathy no thyromegaly Chest:  CTA CV:  RRR without murmur or rub.  Radial and DP pulses normal and equal Abd:  S, NT, No HSM or mass, + BS LE:  No edema Feet appear healthy without lesion   Assessment & Plan   1.  DM:  Encouraged continuing to work on diet and daily physical activity. Encouraged checking sugars twice daily before meals. Continue Metformin. A1C and urine microalbumin/crea with fasting labs in 2 weeks Influenza and pneumococcal 23 v vaccines today.  2.  Hyperlipidemia:  FLP in 2 weeks.  3.  Financial issues:  [phone] for mortgage payment this month--having  problems affording.  Due Nov 15th.  To bring in information regarding landlord and exact amount due to see if we can support.

## 2019-03-06 NOTE — Patient Instructions (Signed)

## 2019-03-26 ENCOUNTER — Other Ambulatory Visit (INDEPENDENT_AMBULATORY_CARE_PROVIDER_SITE_OTHER): Payer: Self-pay

## 2019-03-26 DIAGNOSIS — E7800 Pure hypercholesterolemia, unspecified: Secondary | ICD-10-CM

## 2019-03-26 DIAGNOSIS — E119 Type 2 diabetes mellitus without complications: Secondary | ICD-10-CM

## 2019-03-27 LAB — MICROALBUMIN / CREATININE URINE RATIO
Creatinine, Urine: 189.6 mg/dL
Microalb/Creat Ratio: 3 mg/g creat (ref 0–29)
Microalbumin, Urine: 5.2 ug/mL

## 2019-03-27 LAB — LIPID PANEL W/O CHOL/HDL RATIO
Cholesterol, Total: 196 mg/dL (ref 100–199)
HDL: 49 mg/dL (ref >39)
LDL Chol Calc (NIH): 121 mg/dL — ABNORMAL HIGH (ref 0–99)
Triglycerides: 149 mg/dL (ref 0–149)
VLDL Cholesterol Cal: 26 mg/dL (ref 5–40)

## 2019-03-27 LAB — HGB A1C W/O EAG: Hgb A1c MFr Bld: 6.4 % — ABNORMAL HIGH (ref 4.8–5.6)

## 2019-04-11 MED ORDER — ATORVASTATIN CALCIUM 20 MG PO TABS: ORAL_TABLET | ORAL | 11 refills | Status: DC

## 2019-04-11 NOTE — Addendum Note (Signed)
Addended by: Marcelino Duster on: 04/11/2019 08:40 AM   Modules accepted: Orders

## 2019-06-04 ENCOUNTER — Other Ambulatory Visit: Payer: Self-pay

## 2019-06-04 ENCOUNTER — Other Ambulatory Visit (INDEPENDENT_AMBULATORY_CARE_PROVIDER_SITE_OTHER): Payer: Self-pay

## 2019-06-04 DIAGNOSIS — Z79899 Other long term (current) drug therapy: Secondary | ICD-10-CM

## 2019-06-04 DIAGNOSIS — E7800 Pure hypercholesterolemia, unspecified: Secondary | ICD-10-CM

## 2019-06-05 LAB — HEPATIC FUNCTION PANEL
ALT: 20 IU/L (ref 0–32)
AST: 23 IU/L (ref 0–40)
Albumin: 3.7 g/dL — ABNORMAL LOW (ref 3.8–4.8)
Alkaline Phosphatase: 84 IU/L (ref 39–117)
Bilirubin Total: 0.2 mg/dL (ref 0.0–1.2)
Bilirubin, Direct: 0.07 mg/dL (ref 0.00–0.40)
Total Protein: 6.6 g/dL (ref 6.0–8.5)

## 2019-06-05 LAB — LIPID PANEL W/O CHOL/HDL RATIO
Cholesterol, Total: 177 mg/dL (ref 100–199)
HDL: 54 mg/dL (ref >39)
LDL Chol Calc (NIH): 93 mg/dL (ref 0–99)
Triglycerides: 175 mg/dL — ABNORMAL HIGH (ref 0–149)
VLDL Cholesterol Cal: 30 mg/dL (ref 5–40)

## 2019-07-04 ENCOUNTER — Encounter: Payer: Self-pay | Admitting: Internal Medicine

## 2019-07-11 ENCOUNTER — Other Ambulatory Visit: Payer: Self-pay

## 2019-07-11 ENCOUNTER — Ambulatory Visit: Payer: Self-pay | Admitting: Internal Medicine

## 2019-07-11 ENCOUNTER — Encounter: Payer: Self-pay | Admitting: Internal Medicine

## 2019-07-11 VITALS — BP 116/80 | HR 64 | Resp 12 | Ht 62.0 in | Wt 203.0 lb

## 2019-07-11 DIAGNOSIS — N939 Abnormal uterine and vaginal bleeding, unspecified: Secondary | ICD-10-CM

## 2019-07-11 LAB — POCT URINE PREGNANCY: Preg Test, Ur: NEGATIVE

## 2019-07-11 MED ORDER — MEDROXYPROGESTERONE ACETATE 10 MG PO TABS: ORAL_TABLET | ORAL | 0 refills | Status: DC

## 2019-07-11 MED ORDER — FERROUS GLUCONATE 324 (38 FE) MG PO TABS: ORAL_TABLET | ORAL | 3 refills | Status: AC

## 2019-07-11 NOTE — Patient Instructions (Signed)
Call clinic with progress on Monday--let us know if your bleeding has stopped.   If it recurs, let us know as well.

## 2019-07-11 NOTE — Progress Notes (Signed)
    Subjective:    Patient ID: Alexis Warren, female   DOB: 03/22/1971, 49 y.o.   MRN: [id number]   HPI  3 days ago started with her period, but with a lot of pain.  Two days ago, developed heavy menstrual flow, requiring tampons and pads.  Felt she was having chills.  Could not go to work due to her heavy flow with clots.   Has had heavy flow in the past, but not like this. Until past year, her periods were very regular, but now often skips a couple of months.   Last period started Feb 7 or 8.  Did have a period in January, but none in December or November.  She has noted her periods are heavier if she skips a month or two.  Describes hot flashes followed by being cold earlier in the year.   No night sweats.   G3P3L3.  All NVD.   Current Meds  Medication Sig  . atorvastatin (LIPITOR) 20 MG tablet 1 tab by mouth daily with evening meal.  . metFORMIN (GLUCOPHAGE) 500 MG tablet Take 1 tablet (500 mg total) by mouth 2 (two) times daily with a meal.   No Known Allergies   Review of Systems    Objective:   BP 116/80 (BP Location: Left Arm, Patient Position: Sitting, Cuff Size: Normal)   Pulse 64   Resp 12   Ht 5\' 2"  (1.575 m)   Wt 203 lb (92.1 kg)   LMP 07/08/2019   BMI 37.13 kg/m   Physical Exam  NAD HEENT:  PERRL, EOMI, palpebral conjunctivae with good color.   Neck:  Supple, No adenopathy, no thyromegaly Chest:  CTA CV:  RRR without murmur or rub.  Radial and DP pulses normal and equal Abd:  S, NT, No HSM or mass, + BS GU:  Large amount vaginal blood.  No CMT.  No uterine mass or tenderness.    Assessment & Plan   1.  Abnormal Uterine bleeding with likely perimenopause:  Medroxyprogesterone 10 mg daily for 10 days.  To call progress report on Monday.  If does not resolve, or recurs, pelvic ultrasound.   Urine HCG negative. CBC, though by history and exam, unlikely to have significant anemia.

## 2019-07-12 LAB — CBC WITH DIFFERENTIAL/PLATELET
Basophils Absolute: 0 10*3/uL (ref 0.0–0.2)
Basos: 1 %
EOS (ABSOLUTE): 0.1 10*3/uL (ref 0.0–0.4)
Eos: 2 %
Hematocrit: 32.9 % — ABNORMAL LOW (ref 34.0–46.6)
Hemoglobin: 11.1 g/dL (ref 11.1–15.9)
Immature Grans (Abs): 0 10*3/uL (ref 0.0–0.1)
Immature Granulocytes: 0 %
Lymphocytes Absolute: 2.9 10*3/uL (ref 0.7–3.1)
Lymphs: 43 %
MCH: 28.6 pg (ref 26.6–33.0)
MCHC: 33.7 g/dL (ref 31.5–35.7)
MCV: 85 fL (ref 79–97)
Monocytes Absolute: 0.5 10*3/uL (ref 0.1–0.9)
Monocytes: 8 %
Neutrophils Absolute: 3.1 10*3/uL (ref 1.4–7.0)
Neutrophils: 46 %
Platelets: 285 10*3/uL (ref 150–450)
RBC: 3.88 x10E6/uL (ref 3.77–5.28)
RDW: 13.2 % (ref 11.7–15.4)
WBC: 6.7 10*3/uL (ref 3.4–10.8)

## 2019-08-08 ENCOUNTER — Encounter: Payer: Self-pay | Admitting: Internal Medicine

## 2019-09-24 ENCOUNTER — Other Ambulatory Visit: Payer: Self-pay

## 2019-09-24 ENCOUNTER — Other Ambulatory Visit (INDEPENDENT_AMBULATORY_CARE_PROVIDER_SITE_OTHER): Payer: Self-pay

## 2019-09-24 DIAGNOSIS — E7800 Pure hypercholesterolemia, unspecified: Secondary | ICD-10-CM

## 2019-09-24 DIAGNOSIS — Z79899 Other long term (current) drug therapy: Secondary | ICD-10-CM

## 2019-09-25 LAB — HEPATIC FUNCTION PANEL
ALT: 58 IU/L — ABNORMAL HIGH (ref 0–32)
AST: 55 IU/L — ABNORMAL HIGH (ref 0–40)
Albumin: 4 g/dL (ref 3.8–4.8)
Alkaline Phosphatase: 99 IU/L (ref 48–121)
Bilirubin Total: 0.3 mg/dL (ref 0.0–1.2)
Bilirubin, Direct: 0.07 mg/dL (ref 0.00–0.40)
Total Protein: 6.9 g/dL (ref 6.0–8.5)

## 2019-09-25 LAB — LIPID PANEL W/O CHOL/HDL RATIO
Cholesterol, Total: 243 mg/dL — ABNORMAL HIGH (ref 100–199)
HDL: 49 mg/dL (ref >39)
LDL Chol Calc (NIH): 170 mg/dL — ABNORMAL HIGH (ref 0–99)
Triglycerides: 133 mg/dL (ref 0–149)
VLDL Cholesterol Cal: 24 mg/dL (ref 5–40)

## 2019-11-06 ENCOUNTER — Other Ambulatory Visit: Payer: Self-pay

## 2019-11-06 ENCOUNTER — Encounter (HOSPITAL_COMMUNITY): Payer: Self-pay | Admitting: Emergency Medicine

## 2019-11-06 ENCOUNTER — Ambulatory Visit (HOSPITAL_COMMUNITY)
Admission: EM | Admit: 2019-11-06 | Discharge: 2019-11-06 | Disposition: A | Discharge status: Home / Self Care | Payer: Self-pay | Attending: Internal Medicine | Admitting: Internal Medicine

## 2019-11-06 DIAGNOSIS — S50362A Insect bite (nonvenomous) of left elbow, initial encounter: Secondary | ICD-10-CM

## 2019-11-06 DIAGNOSIS — W57XXXA Bitten or stung by nonvenomous insect and other nonvenomous arthropods, initial encounter: Secondary | ICD-10-CM

## 2019-11-06 MED ORDER — METHYLPREDNISOLONE SODIUM SUCC 125 MG IJ SOLR
125.0000 mg | Freq: Once | INTRAMUSCULAR | Status: AC
  Administered 2019-11-06: 125 mg via INTRAMUSCULAR

## 2019-11-06 MED ORDER — METHYLPREDNISOLONE SODIUM SUCC 125 MG IJ SOLR
INTRAMUSCULAR | Status: AC
  Filled 2019-11-06: qty 2

## 2019-11-06 MED ORDER — KENALOG 10 MG/ML IJ SUSP: 10.0000 mg | Freq: Once | INTRAMUSCULAR | 0 refills | Status: DC

## 2019-11-06 NOTE — Discharge Instructions (Addendum)
Use Hielo en la area que esta inchado hoy. Peudes tomar Loratadine 10 mg al dia para el escosor. No es receta.

## 2019-11-06 NOTE — ED Provider Notes (Signed)
[facility]    CSN: [id number] Arrival date & time: 11/06/19  1334      History   Chief Complaint Chief Complaint  Patient presents with  . Insect Bite    HPI Alexis Warren is a 49 y.o. female. who presents with an insect bite reaction on L elbow. She was outside in her yard yesterday pm and felt a sting. She thought she scraped her arm, and did not think much about it, but later developed a large whelp and was very itchy. She applied some topical cream for itching and since this am this area is getting worse.    Past Medical History:  Diagnosis Date  . Depression    admission 2011  . Diabetes mellitus without complication (HCC) 2020  . Elevated liver enzymes 12/29/2016  . Hypercholesterolemia 12/29/2016  . Hyperglycemia 02/16/2017  . Obesity 02/16/2017  . Onychomycosis of toenail 11/23/2016    Patient Active Problem List   Diagnosis Date Noted  . Diabetes mellitus without complication (HCC) 2020  . Hyperglycemia 02/16/2017  . Obesity 02/16/2017  . Anemia 02/16/2017  . Hypercholesterolemia 12/29/2016  . Onychomycosis of toenail 11/23/2016  . Major depression 11/12/2009    Past Surgical History:  Procedure Laterality Date  . APPENDECTOMY    . CHOLECYSTECTOMY  09/11/2011   Procedure: LAPAROSCOPIC CHOLECYSTECTOMY WITH INTRAOPERATIVE CHOLANGIOGRAM;  Surgeon: Wilmon Arms. Corliss Skains, MD;  Location: [facility];  Service: General;  Laterality: N/A;  . TUBAL LIGATION     in the 90s    OB History   No obstetric history on file.      Home Medications    Prior to Admission medications   Medication Sig Start Date End Date Taking? Authorizing Provider  atorvastatin (LIPITOR) 20 MG tablet 1 tab by mouth daily with evening meal. 04/11/19   Julieanne Manson, MD  ferrous gluconate (FERGON) 324 MG tablet 1 pastilla dos veces al dia 07/11/19   Julieanne Manson, MD  medroxyPROGESTERone (PROVERA) 10 MG tablet 1 tab by mouth once daily for 10 days. 07/11/19   Julieanne Manson, MD  metFORMIN (GLUCOPHAGE) 500 MG tablet Take 1 tablet (500 mg total) by mouth 2 (two) times daily with a meal. 12/11/18   Julieanne Manson, MD    Family History Family History  Problem Relation Age of Onset  . Heart disease Father   . Breast cancer Other        GM < 50   . Coronary artery disease Other        F MI? age 81s  . Cancer Maternal Grandmother        breast  . Diabetes Neg Hx   . Colon cancer Neg Hx     Social History Social History   Tobacco Use  . Smoking status: Former Smoker    Types: Cigarettes    Quit date: 04/21/2014    Years since quitting: 5.5  . Smokeless tobacco: Never Used  Substance Use Topics  . Alcohol use: No  . Drug use: No     Allergies   Patient has no known allergies.   Review of Systems Review of Systems  HENT: Negative for trouble swallowing.   Respiratory: Negative for shortness of breath.   Gastrointestinal: Positive for nausea. Negative for diarrhea.  Endocrine:       Her fasting glucose have been in the 120's  Skin: Positive for rash.       pruritus     Physical Exam Triage Vital Signs ED Triage Vitals  Enc Vitals Group     BP 11/06/19 1454 108/72     Pulse Rate 11/06/19 1454 74     Resp 11/06/19 1454 18     Temp 11/06/19 1454 98.1 F (36.7 C)     Temp Source 11/06/19 1454 Oral     SpO2 11/06/19 1454 98 %     Weight --      Height --      Head Circumference --      Peak Flow --      Pain Score 11/06/19 1455 4     Pain Loc --      Pain Edu? --      Excl. in GC? --    No data found.  Updated Vital Signs BP 108/72 (BP Location: Right Arm)   Pulse 74   Temp 98.1 F (36.7 C) (Oral)   Resp 18   SpO2 98%   Visual Acuity Right Eye Distance:   Left Eye Distance:   Bilateral Distance:    Right Eye Near:   Left Eye Near:    Bilateral Near:     Physical Exam Vitals and nursing note reviewed.  Constitutional:      General: She is not in acute distress.    Appearance: She is obese. She is  not toxic-appearing.  HENT:     Right Ear: Ear canal normal.     Left Ear: Ear canal normal.  Eyes:     General: No scleral icterus.    Conjunctiva/sclera: Conjunctivae normal.  Cardiovascular:     Rate and Rhythm: Normal rate and regular rhythm.     Heart sounds: No murmur heard.   Pulmonary:     Effort: Pulmonary effort is normal.     Breath sounds: Normal breath sounds.  Abdominal:     General: Bowel sounds are normal.     Palpations: Abdomen is soft.     Tenderness: There is no abdominal tenderness.  Musculoskeletal:        General: Normal range of motion.     Cervical back: Neck supple.  Skin:    General: Skin is warm and dry.     Comments: Has a large hive on L inner elbow region which is a little warm and pink. No streaking noted.   Neurological:     Mental Status: She is alert and oriented to person, place, and time.     Gait: Gait normal.  Psychiatric:        Mood and Affect: Mood normal.        Behavior: Behavior normal.        Thought Content: Thought content normal.        Judgment: Judgment normal.      UC Treatments / Results  Labs (all labs ordered are listed, but only abnormal results are displayed) Labs Reviewed - No data to display  EKG   Radiology No results found.  Procedures Procedures (including critical care time)  Medications Ordered in UC Medications  methylPREDNISolone sodium succinate (SOLU-MEDROL) 125 mg/2 mL injection 125 mg (has no administration in time range)    Initial Impression / Assessment and Plan / UC Course  I have reviewed the triage vital signs and the nursing notes. She was given Solumedrol injection 125 mg IM  here  And advised to monitor her glucose closer. If her glucose increase more than her normal this week, to increase her metformin to tid for 3 days. See instructions  Final Clinical Impressions(s) / UC Diagnoses  Final diagnoses:  Insect bite of left elbow, initial encounter     Discharge Instructions      Use Hielo en la area que esta inchado hoy. Peudes tomar Loratadine 10 mg al dia para el escosor. No es receta.    ED Prescriptions    Medication Sig Dispense Auth. Provider   triamcinolone acetonide (KENALOG) 10 MG/ML injection  (Status: Discontinued) Inject 1 mL (10 mg total) into the muscle once for 1 dose. 5 mL Rodriguez-Southworth, Nettie Elm, PA-C     PDMP not reviewed this encounter.   Garey Ham, PA-C 11/06/19 1540

## 2019-11-06 NOTE — ED Triage Notes (Signed)
Pt here for insect to left elbow with itching, pain and swelling since yesterday

## 2019-12-31 ENCOUNTER — Other Ambulatory Visit: Payer: Self-pay | Admitting: Internal Medicine

## 2020-01-30 ENCOUNTER — Other Ambulatory Visit: Payer: Self-pay

## 2020-01-30 DIAGNOSIS — E119 Type 2 diabetes mellitus without complications: Secondary | ICD-10-CM

## 2020-01-30 DIAGNOSIS — E7800 Pure hypercholesterolemia, unspecified: Secondary | ICD-10-CM

## 2020-01-31 ENCOUNTER — Other Ambulatory Visit: Payer: Self-pay

## 2020-01-31 LAB — LIPID PANEL W/O CHOL/HDL RATIO
Cholesterol, Total: 192 mg/dL (ref 100–199)
HDL: 51 mg/dL (ref >39)
LDL Chol Calc (NIH): 118 mg/dL — ABNORMAL HIGH (ref 0–99)
Triglycerides: 127 mg/dL (ref 0–149)
VLDL Cholesterol Cal: 23 mg/dL (ref 5–40)

## 2020-01-31 LAB — HGB A1C W/O EAG: Hgb A1c MFr Bld: 7.1 % — ABNORMAL HIGH (ref 4.8–5.6)

## 2020-02-03 ENCOUNTER — Other Ambulatory Visit: Payer: Self-pay

## 2020-02-05 ENCOUNTER — Ambulatory Visit: Payer: Self-pay | Admitting: Internal Medicine

## 2020-02-19 ENCOUNTER — Ambulatory Visit: Payer: Self-pay | Admitting: Internal Medicine

## 2020-02-19 VITALS — BP 112/70 | HR 81 | Resp 81 | Ht 62.0 in | Wt 220.0 lb

## 2020-02-19 DIAGNOSIS — Z1231 Encounter for screening mammogram for malignant neoplasm of breast: Secondary | ICD-10-CM

## 2020-02-19 DIAGNOSIS — E7800 Pure hypercholesterolemia, unspecified: Secondary | ICD-10-CM

## 2020-02-19 DIAGNOSIS — E119 Type 2 diabetes mellitus without complications: Secondary | ICD-10-CM

## 2020-02-19 DIAGNOSIS — N939 Abnormal uterine and vaginal bleeding, unspecified: Secondary | ICD-10-CM

## 2020-02-19 NOTE — Patient Instructions (Addendum)
Call in 1 week for flu vaccine appt.  Tome un vaso de agua antes de cada comida Tome un minimo de 6 a 8 vasos de agua diarios Coma tres veces al dia Coma una proteina y Neomia Dear grasa saludable con comida.  (huevos, pescado, pollo, pavo, y limite carnes rojas Coma 5 porciones diarias de legumbres.  Mezcle los colores Coma 2 porciones diarias de frutas con cascara cuando sea comestible Use platos pequeos Suelte su tenedor o cuchara despues de cada mordida hata que se mastique y se trague Come en la mesa con amigos o familiares por lo menos una vez al dia Apague la televisin y aparatos electrnicos durante la comida  Su objetivo debe ser perder una libra por semana  Estudios recientes indican que las personas quienes consumen todos de sus calorias durante 12 horas se bajan de pesocon Mas eficiencia.  Por ejemplo, si Usted come su primera comida a las 7:00 a.m., su comida final del dia se debe completar antes de las 7:00 p.m.

## 2020-02-19 NOTE — Progress Notes (Signed)
    Subjective:    Patient ID: Alexis Warren, female   DOB: Feb 09, 1971, 49 y.o.   MRN: [id number]   HPI  1.  Hyperlipidemia:  Improved with Atorvastatin 20 mg daily, but LDL still not at goal.  Has not been physically active due to her issues with her right index finger--ultimately amputated beyond DIP.   Taking Atorvastatin 20 mg daily.   Would like to work on lifestyle changes to get to goal without increasing the Atorvastatin dosing.  2.  DM:  Discussed her A1C is up to 7.1%.  Again, she would like to work on lifestyle changes to get A1C below 7.0%.    3.  Menorrhagia:  Stopped bleeding with progesterone. Not as heavy in general, though skipped her period in September and was heavy this month.  Having hot flashes and night sweats somewhat at this point.  Current Meds  Medication Sig  . atorvastatin (LIPITOR) 20 MG tablet 1 tab by mouth daily with evening meal.  . metFORMIN (GLUCOPHAGE) 500 MG tablet TAKE 1 TABLET BY MOUTH TWICE DAILY WITH  A  MEAL   No Known Allergies   Review of Systems    Objective:   BP 112/70 (BP Location: Left Arm, Patient Position: Sitting, Cuff Size: Normal)   Pulse 81   Resp (!) 81   Ht 5\' 2"  (1.575 m)   Wt 220 lb (99.8 kg)   LMP 02/10/2020   BMI 40.24 kg/m   Physical Exam Obese NAD Lungs:  CTA CV:  RRR without murmur or rub.  Radial and DP pulses normal and equal Abd:  S, NT, No HSM or mass, + BS LE:  No edema.  Assessment & Plan  1.  Hyperlipidemia, not at goal:  Prefers to keep Atorvastatin at current dosing and work on lifestyle changes.  2.  DM, not at goal with control:  As above  3.  Menorrhagia:  Improved, perimenopausal.  To call if problems recur.  4.  HM:  Mammogram.  Vaccinated for COVID.  Discussed booster in February 2022.  Schedule CPE next visit.

## 2020-03-05 ENCOUNTER — Telehealth: Payer: Self-pay | Admitting: Internal Medicine

## 2020-03-05 DIAGNOSIS — N939 Abnormal uterine and vaginal bleeding, unspecified: Secondary | ICD-10-CM

## 2020-03-05 NOTE — Telephone Encounter (Signed)
Patient called stating Dr. Delrae Alfred advised her to call if problems with heavy bleeding continue. Patient states she had heavy bleeding that started on 02/10/20 and lasted 4 days. Patient is now bleeding again, it started on 10/26.Yesterday, she used a pad every hour. But today is worse. Patient would like to know if there is any medicine she can take.  Please advise.

## 2020-03-06 ENCOUNTER — Other Ambulatory Visit: Payer: Self-pay | Admitting: Internal Medicine

## 2020-03-06 MED ORDER — MEDROXYPROGESTERONE ACETATE 10 MG PO TABS: 10.0000 mg | ORAL_TABLET | Freq: Every day | ORAL | 0 refills | Status: DC

## 2020-03-06 NOTE — Telephone Encounter (Signed)
[facility] - Pelvic ultrasounds  Address: [address] [place], [place], [place] [address] Closes 5PM Phone: [phone]  Uninsured- pelvic complete $335.00 ($201.00 with 40% discount if paid at the time of service)  If pelvic transvaginal $334.00 (each) Discount of 40% if paid at time of service If doctor doesn't want the transvaginal it will have to be documented on the order  Appointment available next Thursday or Friday

## 2020-03-06 NOTE — Telephone Encounter (Signed)
She needs to start Medroxy progesterone 10 mg daily for 10 days. Come in for CBC for abnormal uterine bleeding on Tuesday. Needs to be set up for a pelvic ultrasound to see if anything causing the bleeding since keeps occurring.

## 2020-03-06 NOTE — Telephone Encounter (Signed)
Called patient several times and no answer- VM is not set up.

## 2020-03-06 NOTE — Telephone Encounter (Signed)
  Needs excuse letter to go back to work on Monday due to the bleeding problems. Will call Monday to set up appointment for [place]

## 2020-03-06 NOTE — Telephone Encounter (Signed)
CBC lab- scheduled for 03/10/20 at 10 am.

## 2020-03-10 ENCOUNTER — Encounter: Payer: Self-pay | Admitting: Internal Medicine

## 2020-03-10 ENCOUNTER — Other Ambulatory Visit: Payer: Self-pay

## 2020-03-10 DIAGNOSIS — D649 Anemia, unspecified: Secondary | ICD-10-CM

## 2020-03-10 NOTE — Telephone Encounter (Signed)
Patient will empty bladder 1 hour before appointment - drink 32oz of water and  not to use the bathroom Patient to pay $401.40 at the time of service - price includes the 40% discount 03/19/2020  at 10:40 am  arrival time to register and be ready for 11am Spanish Interpreter will be available for patient  [place] will be done at  [facility] located at [address], [place], [place] [address] [phone]

## 2020-03-10 NOTE — Telephone Encounter (Signed)
Excuse dates are for 10/28, 10/29 and 10/30

## 2020-03-11 LAB — CBC WITH DIFFERENTIAL/PLATELET
Basophils Absolute: 0 10*3/uL (ref 0.0–0.2)
Basos: 1 %
EOS (ABSOLUTE): 0.2 10*3/uL (ref 0.0–0.4)
Eos: 3 %
Hematocrit: 28.4 % — ABNORMAL LOW (ref 34.0–46.6)
Hemoglobin: 9.1 g/dL — ABNORMAL LOW (ref 11.1–15.9)
Immature Grans (Abs): 0 10*3/uL (ref 0.0–0.1)
Immature Granulocytes: 0 %
Lymphocytes Absolute: 2.9 10*3/uL (ref 0.7–3.1)
Lymphs: 41 %
MCH: 26.1 pg — ABNORMAL LOW (ref 26.6–33.0)
MCHC: 32 g/dL (ref 31.5–35.7)
MCV: 81 fL (ref 79–97)
Monocytes Absolute: 0.6 10*3/uL (ref 0.1–0.9)
Monocytes: 8 %
Neutrophils Absolute: 3.3 10*3/uL (ref 1.4–7.0)
Neutrophils: 47 %
Platelets: 326 10*3/uL (ref 150–450)
RBC: 3.49 x10E6/uL — ABNORMAL LOW (ref 3.77–5.28)
RDW: 15 % (ref 11.7–15.4)
WBC: 7 10*3/uL (ref 3.4–10.8)

## 2020-03-11 NOTE — Telephone Encounter (Signed)
Pt's VM is not set up Will call back later

## 2020-03-16 NOTE — Telephone Encounter (Signed)
Called patient again today and states she wants to cancel the [place] appointment because she had an emergency and had to spend the money.  Pt. States she will call us when she is ready to go so we can schedule [place] appointment again

## 2020-03-18 ENCOUNTER — Other Ambulatory Visit: Payer: Self-pay

## 2020-03-18 NOTE — Telephone Encounter (Signed)
Called [facility] to cancel appointment per patient's request. Spoke to [place] and cancelled appoitment

## 2020-05-06 ENCOUNTER — Ambulatory Visit (HOSPITAL_COMMUNITY)
Admission: EM | Admit: 2020-05-06 | Discharge: 2020-05-06 | Disposition: A | Discharge status: Home / Self Care | Payer: Self-pay | Attending: Internal Medicine | Admitting: Internal Medicine

## 2020-05-06 ENCOUNTER — Encounter (HOSPITAL_COMMUNITY): Payer: Self-pay | Admitting: *Deleted

## 2020-05-06 ENCOUNTER — Other Ambulatory Visit: Payer: Self-pay

## 2020-05-06 DIAGNOSIS — L0390 Cellulitis, unspecified: Secondary | ICD-10-CM | POA: Insufficient documentation

## 2020-05-06 DIAGNOSIS — R21 Rash and other nonspecific skin eruption: Secondary | ICD-10-CM | POA: Insufficient documentation

## 2020-05-06 LAB — POC URINE PREG, ED: Preg Test, Ur: NEGATIVE

## 2020-05-06 MED ORDER — FLUCONAZOLE 150 MG PO TABS: 150.0000 mg | ORAL_TABLET | ORAL | 0 refills | Status: DC

## 2020-05-06 MED ORDER — HYDROXYZINE HCL 25 MG PO TABS: 12.5000 mg | ORAL_TABLET | Freq: Three times a day (TID) | ORAL | 0 refills | Status: DC | PRN

## 2020-05-06 MED ORDER — CEPHALEXIN 500 MG PO CAPS: 500.0000 mg | ORAL_CAPSULE | Freq: Three times a day (TID) | ORAL | 0 refills | Status: DC

## 2020-05-06 NOTE — ED Triage Notes (Signed)
Pt reports vag infection  For one week . Pt reported she has been scratching  Area and has caused blisters up her legs. Skin is itching.

## 2020-05-06 NOTE — ED Provider Notes (Signed)
[facility]   MRN: [id number] DOB: 07-03-1970  Subjective:   Alexis Warren is a 49 y.o. female presenting for 1 week history of persistent vaginal pain and redness.  Symptoms started after patient shaved over the area using a razor.  She initially felt like the skin was macerated and puslike.  She applied some creams and ointments, vaginocele which made it worse.  Those areas are improved but still has a lot of itching and burning, discomfort.  She has been tested for genital herpes twice and has been negative each time.  She is sexually active with 1 partner.  Denies fever, nausea, vomiting, pelvic pain, vaginal discharge.  Patient has concerns about yeast infection as she has had good relief from using antifungal medications.  No current facility-administered medications for this encounter.  Current Outpatient Medications:  .  atorvastatin (LIPITOR) 20 MG tablet, 1 tab by mouth daily with evening meal., Disp: 30 tablet, Rfl: 11 .  ferrous gluconate (FERGON) 324 MG tablet, 1 pastilla dos veces al dia, Disp: , Rfl: 3 .  medroxyPROGESTERone (PROVERA) 10 MG tablet, Take 1 tablet (10 mg total) by mouth daily., Disp: 10 tablet, Rfl: 0 .  metFORMIN (GLUCOPHAGE) 500 MG tablet, TAKE 1 TABLET BY MOUTH TWICE DAILY WITH A MEAL, Disp: 60 tablet, Rfl: 11   No Known Allergies  Past Medical History:  Diagnosis Date  . Depression    admission 2011  . Diabetes mellitus without complication (HCC) 2020  . Elevated liver enzymes 12/29/2016  . Hypercholesterolemia 12/29/2016  . Hyperglycemia 02/16/2017  . Obesity 02/16/2017  . Onychomycosis of toenail 11/23/2016     Past Surgical History:  Procedure Laterality Date  . APPENDECTOMY    . CHOLECYSTECTOMY  09/11/2011   Procedure: LAPAROSCOPIC CHOLECYSTECTOMY WITH INTRAOPERATIVE CHOLANGIOGRAM;  Surgeon: Wilmon Arms. Corliss Skains, MD;  Location: [facility];  Service: General;  Laterality: N/A;  . TUBAL LIGATION     in the 15s    Family  History  Problem Relation Age of Onset  . Heart disease Father   . Breast cancer Other        GM < 50   . Coronary artery disease Other        F MI? age 45s  . Cancer Maternal Grandmother        breast  . Diabetes Neg Hx   . Colon cancer Neg Hx     Social History   Tobacco Use  . Smoking status: Former Smoker    Types: Cigarettes    Quit date: 04/21/2014    Years since quitting: 6.0  . Smokeless tobacco: Never Used  Substance Use Topics  . Alcohol use: No  . Drug use: No    ROS   Objective:   Vitals: LMP 04/21/2020   Physical Exam Exam conducted with a chaperone present (RN Hutchinson).  Constitutional:      General: She is not in acute distress.    Appearance: Normal appearance. She is well-developed. She is not ill-appearing.  HENT:     Head: Normocephalic and atraumatic.     Nose: Nose normal.     Mouth/Throat:     Mouth: Mucous membranes are moist.     Pharynx: Oropharynx is clear.  Eyes:     General: No scleral icterus.    Extraocular Movements: Extraocular movements intact.     Pupils: Pupils are equal, round, and reactive to light.  Cardiovascular:     Rate and Rhythm: Normal rate.  Pulmonary:  Effort: Pulmonary effort is normal.  Genitourinary:   Skin:    General: Skin is warm and dry.  Neurological:     General: No focal deficit present.     Mental Status: She is alert and oriented to person, place, and time.  Psychiatric:        Mood and Affect: Mood normal.        Behavior: Behavior normal.     Results for orders placed or performed during the hospital encounter of 05/06/20 (from the past 24 hour(s))  POC urine preg, ED (not at [facility])     Status: None   Collection Time: 05/06/20  1:06 PM  Result Value Ref Range   Preg Test, Ur NEGATIVE NEGATIVE    Assessment and Plan :   PDMP not reviewed this encounter.  1. Cellulitis, unspecified cellulitis site   2. Rash of genital area     Will cover for cellulitis given physical exam  findings, start Keflex.  Use hydroxyzine for itching, addressed her concern for yeast infection with Diflucan which can also cover for taking antibiotics.  Labs pending. Counseled patient on potential for adverse effects with medications prescribed/recommended today, ER and return-to-clinic precautions discussed, patient verbalized understanding.    Wallis Bamberg, PA-C 05/06/20 1340

## 2020-05-07 ENCOUNTER — Telehealth (HOSPITAL_COMMUNITY): Payer: Self-pay | Admitting: Emergency Medicine

## 2020-05-07 LAB — CERVICOVAGINAL ANCILLARY ONLY
Bacterial Vaginitis (gardnerella): POSITIVE — AB
Candida Glabrata: NEGATIVE
Candida Vaginitis: NEGATIVE
Chlamydia: NEGATIVE
Comment: NEGATIVE
Comment: NEGATIVE
Comment: NEGATIVE
Comment: NEGATIVE
Comment: NEGATIVE
Comment: NORMAL
Neisseria Gonorrhea: NEGATIVE
Trichomonas: NEGATIVE

## 2020-05-07 MED ORDER — METRONIDAZOLE 500 MG PO TABS: 500.0000 mg | ORAL_TABLET | Freq: Two times a day (BID) | ORAL | 0 refills | Status: DC

## 2020-05-11 ENCOUNTER — Telehealth: Payer: Self-pay | Admitting: Internal Medicine

## 2020-05-11 NOTE — Telephone Encounter (Signed)
Patient called asking for a letter to be off work today and tomorrow. Patient stated that she visited the emergency room due to a vaginal infection. Patient stated that doctor at the ER prescribed a very strong medicine that causes her dizziness, reason why she would like to stay at home today and tomorrow. I told patient that Doctor is very busy but will try to review her notes from the ER visit, however we cannot promised it will happened today.

## 2020-05-12 NOTE — Telephone Encounter (Signed)
Called patient to notified her of letter. No answer and unable to left message.

## 2020-05-12 NOTE — Telephone Encounter (Signed)
Notify I wrote the note for pick up

## 2020-05-13 ENCOUNTER — Encounter (HOSPITAL_COMMUNITY): Payer: Self-pay | Admitting: Emergency Medicine

## 2020-05-13 ENCOUNTER — Ambulatory Visit (HOSPITAL_COMMUNITY)
Admission: EM | Admit: 2020-05-13 | Discharge: 2020-05-13 | Disposition: A | Discharge status: Home / Self Care | Payer: Self-pay | Attending: Internal Medicine | Admitting: Internal Medicine

## 2020-05-13 DIAGNOSIS — B356 Tinea cruris: Secondary | ICD-10-CM

## 2020-05-13 MED ORDER — NYSTATIN 100000 UNIT/GM EX CREA: TOPICAL_CREAM | CUTANEOUS | 0 refills | Status: DC

## 2020-05-13 NOTE — ED Triage Notes (Signed)
Pt states that she still feels she has a vaginal infection. Pt states that she still has vaginal itch. Pt states the medication that she was given to treat the infection makes her feel dizziness when she takes them with her everyday medications.   Denies and vaginal discharge or dysuria.

## 2020-05-13 NOTE — Discharge Instructions (Addendum)
Acavar Metronidasol Pare Cephalexin Tome Diflucan cuando acabe Mitronidasol

## 2020-05-13 NOTE — ED Provider Notes (Signed)
[facility]    CSN: [id number] Arrival date & time: 05/13/20  1301      History   Chief Complaint Chief Complaint  Patient presents with  . Vaginal Itching    HPI Alexis Warren is a 50 y.o. female who present with persistent itching on inner thighs and pubic skin area. Does not know how her DM is doing since she has not checked. She has felt dizzy from one of the medications, but does not know which one. Was here last week for cellulitis of inner thighs, fungal rash and infection and vaginal swab was + for BV. She has 3 more Flagyls left and only took one of the diflucans. Has 3 more days of the keflex left. The redness on inner thighs have resolved.  She is missing work today and would like a note.     Past Medical History:  Diagnosis Date  . Depression    admission 2011  . Diabetes mellitus without complication (HCC) 2020  . Elevated liver enzymes 12/29/2016  . Hypercholesterolemia 12/29/2016  . Hyperglycemia 02/16/2017  . Obesity 02/16/2017  . Onychomycosis of toenail 11/23/2016    Patient Active Problem List   Diagnosis Date Noted  . Diabetes mellitus without complication (HCC) 2020  . Hyperglycemia 02/16/2017  . Obesity 02/16/2017  . Anemia 02/16/2017  . Hypercholesterolemia 12/29/2016  . Onychomycosis of toenail 11/23/2016  . Major depression 11/12/2009    Past Surgical History:  Procedure Laterality Date  . APPENDECTOMY    . CHOLECYSTECTOMY  09/11/2011   Procedure: LAPAROSCOPIC CHOLECYSTECTOMY WITH INTRAOPERATIVE CHOLANGIOGRAM;  Surgeon: Wilmon Arms. Corliss Skains, MD;  Location: [facility];  Service: General;  Laterality: N/A;  . TUBAL LIGATION     in the 90s    OB History   No obstetric history on file.      Home Medications    Prior to Admission medications   Medication Sig Start Date End Date Taking? Authorizing Provider  atorvastatin (LIPITOR) 20 MG tablet 1 tab by mouth daily with evening meal. 04/11/19  Yes Julieanne Manson, MD   cephALEXin (KEFLEX) 500 MG capsule Take 1 capsule (500 mg total) by mouth 3 (three) times daily. 05/06/20  Yes Wallis Bamberg, PA-C  ferrous gluconate (FERGON) 324 MG tablet 1 pastilla dos veces al dia 07/11/19  Yes Julieanne Manson, MD  fluconazole (DIFLUCAN) 150 MG tablet Take 1 tablet (150 mg total) by mouth once a week. 05/06/20  Yes Wallis Bamberg, PA-C  hydrOXYzine (ATARAX/VISTARIL) 25 MG tablet Take 0.5-1 tablets (12.5-25 mg total) by mouth every 8 (eight) hours as needed for itching. 05/06/20  Yes Wallis Bamberg, PA-C  nystatin cream (MYCOSTATIN) Apply to affected area 2 times daily x 7 days 05/13/20  Yes Rodriguez-Southworth, Nettie Elm, PA-C  medroxyPROGESTERone (PROVERA) 10 MG tablet Take 1 tablet (10 mg total) by mouth daily. 03/06/20   Julieanne Manson, MD  metFORMIN (GLUCOPHAGE) 500 MG tablet TAKE 1 TABLET BY MOUTH TWICE DAILY WITH A MEAL 03/08/20   Julieanne Manson, MD  metroNIDAZOLE (FLAGYL) 500 MG tablet Take 1 tablet (500 mg total) by mouth 2 (two) times daily. 05/07/20   LampteyBritta Mccreedy, MD    Family History Family History  Problem Relation Age of Onset  . Heart disease Father   . Breast cancer Other        GM < 50   . Coronary artery disease Other        F MI? age 76s  . Cancer Maternal Grandmother  breast  . Diabetes Neg Hx   . Colon cancer Neg Hx     Social History Social History   Tobacco Use  . Smoking status: Former Smoker    Types: Cigarettes    Quit date: 04/21/2014    Years since quitting: 6.0  . Smokeless tobacco: Never Used  Substance Use Topics  . Alcohol use: No  . Drug use: No     Allergies   Patient has no known allergies.   Review of Systems Review of Systems + rash on inner thighs and lower abdomen with itching. Denies abnormal vaginal discharge. Denies dizziness, CP, SOB, HA, abdominal pain right now.   Physical Exam Triage Vital Signs ED Triage Vitals  Enc Vitals Group     BP 05/13/20 1547 105/63     Pulse Rate 05/13/20 1547  81     Resp 05/13/20 1547 17     Temp 05/13/20 1547 98.1 F (36.7 C)     Temp Source 05/13/20 1547 Tympanic     SpO2 05/13/20 1547 99 %     Weight --      Height --      Head Circumference --      Peak Flow --      Pain Score 05/13/20 1552 7     Pain Loc --      Pain Edu? --      Excl. in GC? --    No data found.  Updated Vital Signs BP 105/63 (BP Location: Right Arm)   Pulse 81   Temp 98.1 F (36.7 C) (Tympanic)   Resp 17   LMP 04/21/2020   SpO2 99%   Visual Acuity Right Eye Distance:   Left Eye Distance:   Bilateral Distance:    Right Eye Near:   Left Eye Near:    Bilateral Near:     Physical Exam Vitals and nursing note reviewed.  Constitutional:      General: She is not in acute distress.    Appearance: She is obese. She is not toxic-appearing.  HENT:     Right Ear: External ear normal.  Eyes:     Conjunctiva/sclera: Conjunctivae normal.  Pulmonary:     Effort: Pulmonary effort is normal.  Musculoskeletal:     Cervical back: Neck supple.  Skin:    General: Skin is warm and dry.     Findings: Rash present.     Comments: Has no erythema or warmth on area of inner thighs where she had the cellulitis. Has hyperpigmented skin in groin region. Abdominal skin is normal.   Neurological:     Mental Status: She is alert and oriented to person, place, and time.     Gait: Gait normal.  Psychiatric:        Mood and Affect: Mood normal.        Behavior: Behavior normal.        Thought Content: Thought content normal.        Judgment: Judgment normal.    UC Treatments / Results  Labs (all labs ordered are listed, but only abnormal results are displayed) Labs Reviewed - No data to display  EKG   Radiology No results found.  Procedures Procedures (including critical care time)  Medications Ordered in UC Medications - No data to display  Initial Impression / Assessment and Plan / UC Course  I have reviewed the triage vital signs and the nursing  notes. BV under treatment and advised to finish the Flagyl Cellulitis has  resolved, so was told she may d/c the keflex. Educated to check her glucose and if fasting is > 100 she needs to be more strict with avoiding too much carbs( denies eating sweets) since this will cause worse fungal infections.  Advised to take the Diflucan pill she has at home when done with the flagyl I prescribed her Nystatin cream to apply on the inner thigh where she is itching.    Final Clinical Impressions(s) / UC Diagnoses   Final diagnoses:  Tinea cruris     Discharge Instructions     Acavar Metronidasol Pare Cephalexin Tome Diflucan cuando acabe Mitronidasol    ED Prescriptions    Medication Sig Dispense Auth. Provider   nystatin cream (MYCOSTATIN) Apply to affected area 2 times daily x 7 days 30 g Rodriguez-Southworth, Sunday Spillers, PA-C     PDMP not reviewed this encounter.   Shelby Mattocks, [place] 05/13/20 1810

## 2020-05-22 ENCOUNTER — Other Ambulatory Visit: Payer: Self-pay

## 2020-06-01 ENCOUNTER — Other Ambulatory Visit: Payer: Self-pay | Admitting: Internal Medicine

## 2020-06-01 ENCOUNTER — Other Ambulatory Visit: Payer: Self-pay

## 2020-06-01 DIAGNOSIS — Z1159 Encounter for screening for other viral diseases: Secondary | ICD-10-CM

## 2020-06-01 DIAGNOSIS — E7800 Pure hypercholesterolemia, unspecified: Secondary | ICD-10-CM

## 2020-06-01 DIAGNOSIS — D649 Anemia, unspecified: Secondary | ICD-10-CM

## 2020-06-01 DIAGNOSIS — R748 Abnormal levels of other serum enzymes: Secondary | ICD-10-CM

## 2020-06-01 DIAGNOSIS — E119 Type 2 diabetes mellitus without complications: Secondary | ICD-10-CM

## 2020-06-01 DIAGNOSIS — Z114 Encounter for screening for human immunodeficiency virus [HIV]: Secondary | ICD-10-CM

## 2020-06-01 NOTE — Progress Notes (Signed)
Here for fasting labs:  FLP, CMP, CBC, A1C, HIV, HepC and urine microalbumin/crea

## 2020-06-02 LAB — COMPREHENSIVE METABOLIC PANEL
ALT: 48 IU/L — ABNORMAL HIGH (ref 0–32)
AST: 38 IU/L (ref 0–40)
Albumin/Globulin Ratio: 1.3 (ref 1.2–2.2)
Albumin: 4 g/dL (ref 3.8–4.8)
Alkaline Phosphatase: 81 IU/L (ref 44–121)
BUN/Creatinine Ratio: 16 (ref 9–23)
BUN: 10 mg/dL (ref 6–24)
Bilirubin Total: 0.4 mg/dL (ref 0.0–1.2)
CO2: 22 mmol/L (ref 20–29)
Calcium: 9.1 mg/dL (ref 8.7–10.2)
Chloride: 103 mmol/L (ref 96–106)
Creatinine, Ser: 0.62 mg/dL (ref 0.57–1.00)
GFR calc Af Amer: 122 mL/min/{1.73_m2} (ref >59)
GFR calc non Af Amer: 105 mL/min/{1.73_m2} (ref >59)
Globulin, Total: 3.1 g/dL (ref 1.5–4.5)
Glucose: 123 mg/dL — ABNORMAL HIGH (ref 65–99)
Potassium: 4.1 mmol/L (ref 3.5–5.2)
Sodium: 138 mmol/L (ref 134–144)
Total Protein: 7.1 g/dL (ref 6.0–8.5)

## 2020-06-02 LAB — CBC WITH DIFFERENTIAL/PLATELET
Basophils Absolute: 0 10*3/uL (ref 0.0–0.2)
Basos: 1 %
EOS (ABSOLUTE): 0.3 10*3/uL (ref 0.0–0.4)
Eos: 5 %
Hematocrit: 36.8 % (ref 34.0–46.6)
Hemoglobin: 11.9 g/dL (ref 11.1–15.9)
Immature Grans (Abs): 0 10*3/uL (ref 0.0–0.1)
Immature Granulocytes: 0 %
Lymphocytes Absolute: 2.5 10*3/uL (ref 0.7–3.1)
Lymphs: 44 %
MCH: 26.4 pg — ABNORMAL LOW (ref 26.6–33.0)
MCHC: 32.3 g/dL (ref 31.5–35.7)
MCV: 82 fL (ref 79–97)
Monocytes Absolute: 0.4 10*3/uL (ref 0.1–0.9)
Monocytes: 8 %
Neutrophils Absolute: 2.3 10*3/uL (ref 1.4–7.0)
Neutrophils: 42 %
Platelets: 243 10*3/uL (ref 150–450)
RBC: 4.5 x10E6/uL (ref 3.77–5.28)
RDW: 19.7 % — ABNORMAL HIGH (ref 11.7–15.4)
WBC: 5.6 10*3/uL (ref 3.4–10.8)

## 2020-06-02 LAB — HGB A1C W/O EAG: Hgb A1c MFr Bld: 6.5 % — ABNORMAL HIGH (ref 4.8–5.6)

## 2020-06-02 LAB — HIV ANTIBODY (ROUTINE TESTING W REFLEX): HIV Screen 4th Generation wRfx: NONREACTIVE

## 2020-06-02 LAB — LIPID PANEL W/O CHOL/HDL RATIO
Cholesterol, Total: 200 mg/dL — ABNORMAL HIGH (ref 100–199)
HDL: 40 mg/dL (ref >39)
LDL Chol Calc (NIH): 120 mg/dL — ABNORMAL HIGH (ref 0–99)
Triglycerides: 229 mg/dL — ABNORMAL HIGH (ref 0–149)
VLDL Cholesterol Cal: 40 mg/dL (ref 5–40)

## 2020-06-02 LAB — MICROALBUMIN / CREATININE URINE RATIO
Creatinine, Urine: 339.3 mg/dL
Microalb/Creat Ratio: 5 mg/g creat (ref 0–29)
Microalbumin, Urine: 16.5 ug/mL

## 2020-06-02 LAB — HEPATITIS C ANTIBODY: Hep C Virus Ab: <0.1 s/co ratio (ref 0.0–0.9)

## 2020-06-03 ENCOUNTER — Encounter: Payer: Self-pay | Admitting: Internal Medicine

## 2020-06-03 ENCOUNTER — Other Ambulatory Visit: Payer: Self-pay

## 2020-06-03 ENCOUNTER — Other Ambulatory Visit: Payer: Self-pay | Admitting: Internal Medicine

## 2020-06-03 ENCOUNTER — Ambulatory Visit: Payer: Self-pay | Admitting: Internal Medicine

## 2020-06-03 VITALS — BP 107/70 | HR 81 | Resp 12 | Ht 62.0 in | Wt 214.0 lb

## 2020-06-03 DIAGNOSIS — Z0000 Encounter for general adult medical examination without abnormal findings: Secondary | ICD-10-CM

## 2020-06-03 DIAGNOSIS — Z124 Encounter for screening for malignant neoplasm of cervix: Secondary | ICD-10-CM

## 2020-06-03 DIAGNOSIS — Z1231 Encounter for screening mammogram for malignant neoplasm of breast: Secondary | ICD-10-CM

## 2020-06-03 DIAGNOSIS — E119 Type 2 diabetes mellitus without complications: Secondary | ICD-10-CM

## 2020-06-03 MED ORDER — ATORVASTATIN CALCIUM 40 MG PO TABS: ORAL_TABLET | ORAL | 11 refills | Status: DC

## 2020-06-03 NOTE — Progress Notes (Signed)
Subjective:    Patient ID: Alexis Warren, female   DOB: 06-Feb-1971, 50 y.o.   MRN: [id number]   HPI   CPE with pap  1.  Pap:  Cannot recall last pap.  Years ago.  She states was normal.    2.  Mammogram:  States she was to be charged $600 for mammogram.  Discussed should have been free through [employer].  Sounds like she is actually talking about the pelvic ultrasound.  She has not scheduled as she cannot afford to pay any medical bills currently. She has not applied for an orange card, which would save her a lot of money.  3.  Osteoprevention:  Drinks 1 % milk, 2 cups daily.  Willing to increase to 3 cups daily.  Walks and rides a stationary bike 3 times weekly.  30 minutes each.    4.  Guaiac Cards:  Never.    5.  Colonoscopy:  Never.  No family history of colon cancer.   6.  Immunizations:  Needs covid booster next month  Immunization History  Administered Date(s) Administered   Influenza Inj Mdck Quad Pf 03/06/2019   Moderna Sars-Covid-2 Vaccination 11/12/2019, 12/09/2019   Pneumococcal Polysaccharide-23 03/06/2019   Tdap 04/08/2011, 05/27/2019     7.  Glucose/Cholesterol:  A1C improved to 6.5% on 06/01/20.  Cholesterol recently higher and not at goal. She states she has not been missing her Atorvastatin. Lipid Panel     Component Value Date/Time   CHOL 200 (H) 06/01/2020 0914   TRIG 229 (H) 06/01/2020 0914   HDL 40 06/01/2020 0914   CHOLHDL 4 04/08/2011 1123   VLDL 22.2 04/08/2011 1123   LDLCALC 120 (H) 06/01/2020 0914   LDLDIRECT 143.9 04/08/2011 1123   LABVLDL 40 06/01/2020 0914   Other:  AST now normal and ALT improved.   Anemia normalized now with iron supplementation.  Current Meds  Medication Sig   atorvastatin (LIPITOR) 20 MG tablet 1 tab by mouth daily with evening meal.   ferrous gluconate (FERGON) 324 MG tablet 1 pastilla dos veces al dia   metFORMIN (GLUCOPHAGE) 500 MG tablet TAKE 1 TABLET BY MOUTH TWICE DAILY WITH A MEAL   No Known  Allergies  Past Medical History:  Diagnosis Date   Anemia    Depression    admission 2011   Diabetes mellitus without complication (HCC) 2020   Elevated liver enzymes 12/29/2016   Hypercholesterolemia 12/29/2016   Hyperglycemia 02/16/2017   Obesity 02/16/2017   Onychomycosis of toenail 11/23/2016    Past Surgical History:  Procedure Laterality Date   APPENDECTOMY     CHOLECYSTECTOMY  09/11/2011   Procedure: LAPAROSCOPIC CHOLECYSTECTOMY WITH INTRAOPERATIVE CHOLANGIOGRAM;  Surgeon: Wilmon Arms. Corliss Skains, MD;  Location: [facility] OR;  Service: General;  Laterality: N/A;   TUBAL LIGATION     in the 70s    Family History  Problem Relation Age of Onset   Diabetes Mother    Heart disease Father        MI   Breast cancer Other        GM < 50    Coronary artery disease Other        F MI? age 17s   Cancer Maternal Grandmother        breast   Breast cancer Maternal Grandmother    Colitis Daughter    Hypertension Daughter        Possibly ulcerative   Gout Sister    Gout Brother    Social  History   Socioeconomic History   Marital status: Significant Other    Spouse name: Not on file   Number of children: 3   Years of education: 6   Highest education level: 6th grade  Occupational History   Occupation: [employer]  Tobacco Use   Smoking status: Former    Types: Cigarettes    Quit date: 04/21/2014    Years since quitting: 7.1   Smokeless tobacco: Never  Vaping Use   Vaping Use: Never used  Substance and Sexual Activity   Alcohol use: Yes    Comment: Occasional.   Drug use: No   Sexual activity: Yes    Birth control/protection: None  Other Topics Concern   Not on file  Social History Narrative   Lives only with her daughter.     Divorced now and has a significant other.   Social Determinants of Health   Financial Resource Strain: Not on file  Food Insecurity: Not on file  Transportation Needs: No Transportation Needs   Lack of Transportation (Medical): No    Lack of Transportation (Non-Medical): No  Physical Activity: Not on file  Stress: Not on file  Social Connections: Not on file  Intimate Partner Violence: Not on file      Review of Systems  Eyes: Negative for visual disturbance (Has not had eyes checked this year. ).  Respiratory: Negative for shortness of breath.   Cardiovascular: Positive for leg swelling (At times in past, but nothing last 2 months.  ). Negative for chest pain.  Genitourinary:       Did not go for pelvic ultrasound.  Ultimately, was too expensive as has increased spending with son. Periods have been much lighter in December and January.        Objective:   BP 107/70 (BP Location: Left Arm, Patient Position: Sitting, Cuff Size: Normal)   Pulse 81   Resp 12   Ht 5\' 2"  (1.575 m)   Wt 214 lb (97.1 kg)   LMP 05/24/2020 (Approximate)   BMI 39.14 kg/m   Physical Exam HENT:     Head: Normocephalic and atraumatic.     Right Ear: Tympanic membrane, ear canal and external ear normal.     Left Ear: Tympanic membrane, ear canal and external ear normal.     Mouth/Throat:     Mouth: Mucous membranes are moist.     Pharynx: Oropharynx is clear.  Eyes:     Extraocular Movements: Extraocular movements intact.     Conjunctiva/sclera: Conjunctivae normal.     Pupils: Pupils are equal, round, and reactive to light.     Funduscopic exam:    Right eye: Red reflex present.        Left eye: Red reflex present.    Comments: Discs sharp bilaterally.  Neck:     Thyroid: No thyroid mass or thyromegaly.  Cardiovascular:     Rate and Rhythm: Normal rate and regular rhythm.     Pulses:          Dorsalis pedis pulses are 2+ on the right side and 2+ on the left side.       Posterior tibial pulses are 2+ on the right side and 2+ on the left side.     Heart sounds: S1 normal. No murmur heard.   No friction rub. No S3 or S4 sounds.     Comments: No carotid bruits.  Carotid, radial, femoral, DP and PT pulses normal and equal.  Pulmonary:     Effort: Pulmonary effort is normal.     Breath sounds: Normal breath sounds.  Chest:  Breasts:    Right: No inverted nipple, mass, nipple discharge or skin change.     Left: No inverted nipple, mass, nipple discharge or skin change.  Abdominal:     General: Bowel sounds are normal.     Palpations: Abdomen is soft. There is no hepatomegaly, splenomegaly or mass.     Tenderness: There is no abdominal tenderness.     Hernia: No hernia is present.  Genitourinary:    Comments: Normal external female genitalia No vaginal discharge No vaginal or cervical lesions No uterine or adnexal mass or tenderness. Musculoskeletal:        General: Normal range of motion.     Cervical back: Normal range of motion and neck supple.     Right lower leg: No edema.     Left lower leg: No edema.  Feet:     Right foot:     Protective Sensation: 10 sites tested.  10 sites sensed.     Left foot:     Protective Sensation: 10 sites tested.  10 sites sensed.  Lymphadenopathy:     Head:     Right side of head: No submental or submandibular adenopathy.     Left side of head: No submental or submandibular adenopathy.     Cervical: No cervical adenopathy.     Upper Body:     Right upper body: No supraclavicular or axillary adenopathy.     Left upper body: No supraclavicular or axillary adenopathy.     Lower Body: No right inguinal adenopathy. No left inguinal adenopathy.  Skin:    General: Skin is warm.     Capillary Refill: Capillary refill takes less than 2 seconds.  Neurological:     Mental Status: She is alert and oriented to person, place, and time.     Cranial Nerves: Cranial nerves 2-12 are intact.     Sensory: Sensation is intact.     Motor: Motor function is intact.     Coordination: Coordination is intact.     Gait: Gait is intact.     Deep Tendon Reflexes: Reflexes are normal and symmetric.  Psychiatric:        Attention and Perception: Attention normal.        Mood and  Affect: Mood normal.        Speech: Speech normal.        Behavior: Behavior normal. Behavior is cooperative.     Assessment & Plan   CPE with pap Mammogram ordered covid booster in February Guaiac Cards x 3 to return in 2 weeks. Encourage obtaining shingles vaccine at [facility].  2.  Hyperlipidemia:  increase Atorvastatin to 40 mg daily FLP with hepatic profile in 6-8 weeks.  3.  DM:  at goal for control.  Encouraged going to optometrist of choice for yearly DM eye exam  4.  Abnormal uterine bleeding:  this has subsided.  Could not afford ultrasound and has not obtained orange card.

## 2020-06-09 LAB — CYTOLOGY - PAP

## 2020-06-11 ENCOUNTER — Other Ambulatory Visit: Payer: Self-pay | Admitting: Obstetrics and Gynecology

## 2020-06-11 DIAGNOSIS — Z1231 Encounter for screening mammogram for malignant neoplasm of breast: Secondary | ICD-10-CM

## 2020-06-17 ENCOUNTER — Other Ambulatory Visit (INDEPENDENT_AMBULATORY_CARE_PROVIDER_SITE_OTHER): Payer: Self-pay | Admitting: Internal Medicine

## 2020-06-17 DIAGNOSIS — Z862 Personal history of diseases of the blood and blood-forming organs and certain disorders involving the immune mechanism: Secondary | ICD-10-CM

## 2020-06-19 LAB — POC HEMOCCULT BLD/STL (HOME/3-CARD/SCREEN)
Card #2 Fecal Occult Blod, POC: NEGATIVE
Card #3 Fecal Occult Blood, POC: NEGATIVE
Fecal Occult Blood, POC: NEGATIVE

## 2020-06-19 NOTE — Progress Notes (Signed)
Guaiac cards brought in Wednesday All negative for occult blood

## 2020-07-16 ENCOUNTER — Other Ambulatory Visit: Payer: Self-pay

## 2020-07-16 ENCOUNTER — Ambulatory Visit
Admission: RE | Admit: 2020-07-16 | Discharge: 2020-07-16 | Disposition: A | Discharge status: Home / Self Care | Payer: No Typology Code available for payment source | Source: Ambulatory Visit | Attending: Obstetrics and Gynecology | Admitting: Obstetrics and Gynecology

## 2020-07-16 ENCOUNTER — Ambulatory Visit: Payer: No Typology Code available for payment source | Admitting: *Deleted

## 2020-07-16 VITALS — Wt 218.5 lb

## 2020-07-16 DIAGNOSIS — Z1239 Encounter for other screening for malignant neoplasm of breast: Secondary | ICD-10-CM

## 2020-07-16 DIAGNOSIS — Z1231 Encounter for screening mammogram for malignant neoplasm of breast: Secondary | ICD-10-CM

## 2020-07-16 NOTE — Patient Instructions (Addendum)
Explained breast self awareness with Ammie Ferrier. Patient did not need a Pap smear today due to last Pap smear was 06/03/2020. Let her know B[facility]CP will cover Pap smears every 3 years unless has a history of abnormal Pap smears. Referred patient to the [facility] for a screening mammogram on the mobile unit. Appointment scheduled Thursday, July 16, 2020 at 1530. Patient escorted to the mobile unit following BCCCP appointment for her screening mammogram. Let patient know the Breast Center will follow up with her within the next couple weeks with results of her mammogram by letter or phone. Ammie Ferrier verbalized understanding.  Judith Demps, Kathaleen Maser, RN 3:21 PM

## 2020-07-16 NOTE — Progress Notes (Signed)
Ms. Angelyna Henderson is a 50 y.o. female who presents to [facility] clinic today with no complaints.    Pap Smear: Pap smear not completed today. Last Pap smear was 06/03/2020 at [facility] clinic and was normal. Per patient has no history of an abnormal Pap smear. Last Pap smear result is available in Epic.   Physical exam: Breasts Right breast slightly larger than left breast. Per patient has not noticed any changes. No skin abnormalities bilateral breasts. No nipple retraction bilateral breasts. No nipple discharge bilateral breasts. No lymphadenopathy. No lumps palpated bilateral breasts. No complaints of pain or tenderness on exam.      Pelvic/Bimanual Pap is not indicated today per BCCCP guidelines.   Smoking History: Patient is a former smoker that quit 04/21/2014.   Patient Navigation: Patient education provided. Access to services provided for patient through [facility] program. Spanish interpreter Alene Mires from [facility] provided.   Colorectal Cancer Screening: Per patient has never had colonoscopy completed. Patient completed the FOBT x 3 samples that were negative. No complaints today.    Breast and Cervical Cancer Risk Assessment: Patient has family history of her maternal grandmother having breast cancer. Patient has no known genetic mutations or history of radiation treatment to the chest before age 21. Patient does not have history of cervical dysplasia, immunocompromised, or DES exposure in-utero.  Risk Assessment    Risk Scores      07/16/2020   Last edited by: Narda Rutherford, LPN   5-year risk: 0.5 %   Lifetime risk: 5 %          A: BCCCP exam without pap smear No complaints.  P: Referred patient to the [facility] for a screening mammogram on the mobile unit. Appointment scheduled Thursday, July 16, 2020 at 1530.  Priscille Heidelberg, RN 07/16/2020 3:21 PM

## 2020-08-03 ENCOUNTER — Other Ambulatory Visit: Payer: Self-pay | Admitting: Internal Medicine

## 2020-08-03 ENCOUNTER — Other Ambulatory Visit: Payer: Self-pay

## 2020-08-03 DIAGNOSIS — E7800 Pure hypercholesterolemia, unspecified: Secondary | ICD-10-CM

## 2020-08-03 DIAGNOSIS — Z79899 Other long term (current) drug therapy: Secondary | ICD-10-CM

## 2020-08-04 LAB — HEPATIC FUNCTION PANEL
ALT: 64 IU/L — ABNORMAL HIGH (ref 0–32)
AST: 58 IU/L — ABNORMAL HIGH (ref 0–40)
Albumin: 3.8 g/dL (ref 3.8–4.8)
Alkaline Phosphatase: 89 IU/L (ref 44–121)
Bilirubin Total: 0.3 mg/dL (ref 0.0–1.2)
Bilirubin, Direct: <0.1 mg/dL (ref 0.00–0.40)
Total Protein: 6.6 g/dL (ref 6.0–8.5)

## 2020-08-04 LAB — LIPID PANEL W/O CHOL/HDL RATIO
Cholesterol, Total: 179 mg/dL (ref 100–199)
HDL: 49 mg/dL (ref >39)
LDL Chol Calc (NIH): 108 mg/dL — ABNORMAL HIGH (ref 0–99)
Triglycerides: 121 mg/dL (ref 0–149)
VLDL Cholesterol Cal: 22 mg/dL (ref 5–40)

## 2020-12-02 ENCOUNTER — Encounter: Payer: Self-pay | Admitting: Internal Medicine

## 2020-12-02 ENCOUNTER — Other Ambulatory Visit: Payer: Self-pay

## 2020-12-02 ENCOUNTER — Ambulatory Visit: Payer: Self-pay | Admitting: Internal Medicine

## 2020-12-02 VITALS — BP 112/78 | HR 72 | Resp 22 | Ht 62.0 in | Wt 214.0 lb

## 2020-12-02 DIAGNOSIS — E7800 Pure hypercholesterolemia, unspecified: Secondary | ICD-10-CM

## 2020-12-02 DIAGNOSIS — Z780 Asymptomatic menopausal state: Secondary | ICD-10-CM

## 2020-12-02 DIAGNOSIS — E119 Type 2 diabetes mellitus without complications: Secondary | ICD-10-CM

## 2020-12-02 NOTE — Progress Notes (Signed)
    Subjective:    Patient ID: Alexis Warren, female   DOB: [birth year], 50 y.o.   MRN: [id number]   HPI  Interpreted by Tildon Husky.   Hyperlipidemia:  Has repeat check next month.  Was improved last check in March with increased Atorvastatin, though still not at goal with LDL at 108.  She does feel she is more physically active with a newer job since last checked.  She has not really changed her diet.  She has tried to decrease carbs.    2.  DM:  Last A1C in January was 6.5%.  Does not check her sugars.  No polydipsia or polyuria, though just drinks a lot of water daily purposefully.   Has not had eye check in past year.  3.  HM:  Has not had a booster for COVID vaccine.    4.  No period since April.  Is having occasional night sweat and hot flashes.    Current Meds  Medication Sig   atorvastatin (LIPITOR) 40 MG tablet 1 tab by mouth daily with evening meal.   ferrous gluconate (FERGON) 324 MG tablet 1 pastilla dos veces al dia   metFORMIN (GLUCOPHAGE) 500 MG tablet TAKE 1 TABLET BY MOUTH TWICE DAILY WITH A MEAL   No Known Allergies   Review of Systems    Objective:   BP 112/78 (BP Location: Right Arm, Patient Position: Sitting, Cuff Size: Normal)   Pulse 72   Resp (!) 22   Ht 5\' 2"  (1.575 m)   Wt 214 lb (97.1 kg)   BMI 39.14 kg/m   Physical Exam NAD HEENT:  PERRL EOMI, TMs pearly gray Neck:  Supple, No adenopathy, no thyromegaly Chest:  CTA CV:  RRR with normal S1 and S2, No S3, S4 or murmur.  No carotid bruits.  Carotid, radial, DP pulses normal and equal Abd:  S, NT, No HSM or mass, +BS LE:  No edema.  Diabetic Foot Exam - Simple   Simple Foot Form Diabetic Foot exam was performed with the following findings: Yes 12/02/2020  4:33 PM  Visual Inspection No deformities, no ulcerations, no other skin breakdown bilaterally: Yes Sensation Testing Intact to touch and monofilament testing bilaterally: Yes Pulse Check Posterior Tibialis and Dorsalis pulse  intact bilaterally: Yes Comments      Assessment & Plan    Hypercholesterolemia:  recheck of FLP next month.  Continue current dose of Atorvastatin.  2.  DM:  has been well controlled.  Add A1C to labs next month.  Referral for eye exam.   3.  HM:  covid vaccine not available today for booster--will obtain with lab visit next month  4.  Menopause:  discussed symptoms, staying physically active and continuing to work on eating in healthy manner.  Call if not tolerating symptoms, but not a good candidate for ERT.

## 2020-12-28 ENCOUNTER — Other Ambulatory Visit: Payer: Self-pay | Admitting: Internal Medicine

## 2020-12-28 ENCOUNTER — Other Ambulatory Visit: Payer: Self-pay

## 2020-12-28 DIAGNOSIS — E7800 Pure hypercholesterolemia, unspecified: Secondary | ICD-10-CM

## 2020-12-28 DIAGNOSIS — Z79899 Other long term (current) drug therapy: Secondary | ICD-10-CM

## 2020-12-28 DIAGNOSIS — Z23 Encounter for immunization: Secondary | ICD-10-CM

## 2020-12-28 DIAGNOSIS — E119 Type 2 diabetes mellitus without complications: Secondary | ICD-10-CM

## 2020-12-29 LAB — HEMOGLOBIN A1C
Est. average glucose Bld gHb Est-mCnc: 151 mg/dL
Hgb A1c MFr Bld: 6.9 % — ABNORMAL HIGH (ref 4.8–5.6)

## 2020-12-29 LAB — HEPATIC FUNCTION PANEL
ALT: 31 IU/L (ref 0–32)
AST: 33 IU/L (ref 0–40)
Albumin: 4 g/dL (ref 3.8–4.8)
Alkaline Phosphatase: 114 IU/L (ref 44–121)
Bilirubin Total: 0.2 mg/dL (ref 0.0–1.2)
Bilirubin, Direct: <0.1 mg/dL (ref 0.00–0.40)
Total Protein: 7 g/dL (ref 6.0–8.5)

## 2020-12-29 LAB — LIPID PANEL W/O CHOL/HDL RATIO
Cholesterol, Total: 202 mg/dL — ABNORMAL HIGH (ref 100–199)
HDL: 46 mg/dL (ref >39)
LDL Chol Calc (NIH): 120 mg/dL — ABNORMAL HIGH (ref 0–99)
Triglycerides: 203 mg/dL — ABNORMAL HIGH (ref 0–149)
VLDL Cholesterol Cal: 36 mg/dL (ref 5–40)

## 2021-05-28 ENCOUNTER — Other Ambulatory Visit: Payer: Medicaid Other

## 2021-06-04 ENCOUNTER — Other Ambulatory Visit: Payer: Self-pay

## 2021-06-04 ENCOUNTER — Ambulatory Visit: Payer: Self-pay | Admitting: Internal Medicine

## 2021-06-04 ENCOUNTER — Encounter: Payer: Self-pay | Admitting: Internal Medicine

## 2021-06-04 VITALS — BP 110/78 | HR 68 | Resp 12 | Ht 62.0 in | Wt 212.5 lb

## 2021-06-04 DIAGNOSIS — G8929 Other chronic pain: Secondary | ICD-10-CM

## 2021-06-04 DIAGNOSIS — Z23 Encounter for immunization: Secondary | ICD-10-CM

## 2021-06-04 DIAGNOSIS — Z0000 Encounter for general adult medical examination without abnormal findings: Secondary | ICD-10-CM

## 2021-06-04 DIAGNOSIS — E119 Type 2 diabetes mellitus without complications: Secondary | ICD-10-CM

## 2021-06-04 DIAGNOSIS — M542 Cervicalgia: Secondary | ICD-10-CM

## 2021-06-04 DIAGNOSIS — Z79899 Other long term (current) drug therapy: Secondary | ICD-10-CM

## 2021-06-04 DIAGNOSIS — Z1231 Encounter for screening mammogram for malignant neoplasm of breast: Secondary | ICD-10-CM

## 2021-06-04 DIAGNOSIS — E7800 Pure hypercholesterolemia, unspecified: Secondary | ICD-10-CM

## 2021-06-04 DIAGNOSIS — Z599 Problem related to housing and economic circumstances, unspecified: Secondary | ICD-10-CM

## 2021-06-04 DIAGNOSIS — D649 Anemia, unspecified: Secondary | ICD-10-CM

## 2021-06-04 DIAGNOSIS — M549 Dorsalgia, unspecified: Secondary | ICD-10-CM

## 2021-06-04 DIAGNOSIS — M545 Low back pain, unspecified: Secondary | ICD-10-CM

## 2021-06-04 MED ORDER — ATORVASTATIN CALCIUM 20 MG PO TABS: 20.0000 mg | ORAL_TABLET | Freq: Every day | ORAL | 11 refills | Status: AC

## 2021-06-04 MED ORDER — METFORMIN HCL 500 MG PO TABS: 500.0000 mg | ORAL_TABLET | Freq: Two times a day (BID) | ORAL | 11 refills | Status: DC

## 2021-06-04 MED ORDER — FLUCONAZOLE 150 MG PO TABS: 150.0000 mg | ORAL_TABLET | Freq: Once | ORAL | 0 refills | Status: AC

## 2021-06-04 NOTE — Progress Notes (Signed)
Referral to [facility]/Dulce Robb Matar

## 2021-06-04 NOTE — Progress Notes (Unsigned)
° ° °  Subjective:    Patient ID: Alexis Warren, female   DOB: 07-06-1970, 51 y.o.   MRN: [id number]  Last    HPI  CPE without pap  1.  Pap:  Normal 06/03/20.    2.  Mammogram:  Normal 07/16/2020  3.  Osteoprevention:  Drinking 3 cups milk daily. Not very physically active as too cold outside  4.  Guaiac Cards/FIT:  Guaiac Cards last 06/19/20 and negative for blood.  5.  Colonoscopy:  Never.  No family history of colon cancer.  6.  Immunizations:  Did not get Influenza vaccine this year.  Needs Moderna Bivalent and 2nd Shingrix.  7.  Glucose/Cholesterol:  Last A1C 12/28/20:  6.9%.  Last FLP going in wrong direction, not at goal and felt likely she was missing doses of Atorvastatin 40 mg as better controlled earlier in year.  Later, clear she is only taking some sort of cholesterol lowering supplement and not taking Atorvastatin at all.  After speaking with pharmacy, she has never picked up the Atorvastatin, even when 20 mg.   Lipid Panel     Component Value Date/Time   CHOL 202 (H) 12/28/2020 0836   TRIG 203 (H) 12/28/2020 0836   HDL 46 12/28/2020 0836   CHOLHDL 4 04/08/2011 1123   VLDL 22.2 04/08/2011 1123   LDLCALC 120 (H) 12/28/2020 0836   LDLDIRECT 143.9 04/08/2011 1123   LABVLDL 36 12/28/2020 0836      Current Meds  Medication Sig   atorvastatin (LIPITOR) 40 MG tablet 1 tab by mouth daily with evening meal.   metFORMIN (GLUCOPHAGE) 500 MG tablet TAKE 1 TABLET BY MOUTH TWICE DAILY WITH A MEAL   No Known Allergies   Review of Systems  Eyes:  Negative for visual disturbance (No diabetic eye check this year.).  Respiratory:  Negative for shortness of breath.   Cardiovascular:  Negative for chest pain, palpitations and leg swelling.  Gastrointestinal:  Negative for blood in stool (No melena.).  Genitourinary:  Negative for vaginal discharge (But with external vulvar itching).  Musculoskeletal:        Back pain:  was doing heavy lifting and developed back pain.   Went to chiropractor for adjustment.  She was going 3 times weekly and gradually decreased to 1 visit weekly.  Couldn't afford to continue.   Left neck to low back with pain. She would be willing to go to PT    Neurological:  Negative for weakness and numbness.  Psychiatric/Behavioral:  Negative for dysphoric mood. The patient is nervous/anxious [facility] when has problems.).      Objective:   BP 110/78 (BP Location: Right Arm, Patient Position: Sitting, Cuff Size: Normal)    Pulse 68    Resp 12    Ht 5\' 2"  (1.575 m)    Wt 212 lb 8 oz (96.4 kg)    LMP 01/08/2021 (Within Weeks)    BMI 38.87 kg/m   Physical Exam   Assessment & Plan    CPE without pap

## 2021-06-05 LAB — CBC WITH DIFFERENTIAL/PLATELET
Basophils Absolute: 0 10*3/uL (ref 0.0–0.2)
Basos: 1 %
EOS (ABSOLUTE): 0.2 10*3/uL (ref 0.0–0.4)
Eos: 3 %
Hematocrit: 35 % (ref 34.0–46.6)
Hemoglobin: 12.1 g/dL (ref 11.1–15.9)
Immature Grans (Abs): 0 10*3/uL (ref 0.0–0.1)
Immature Granulocytes: 0 %
Lymphocytes Absolute: 2.4 10*3/uL (ref 0.7–3.1)
Lymphs: 38 %
MCH: 29.8 pg (ref 26.6–33.0)
MCHC: 34.6 g/dL (ref 31.5–35.7)
MCV: 86 fL (ref 79–97)
Monocytes Absolute: 0.5 10*3/uL (ref 0.1–0.9)
Monocytes: 8 %
Neutrophils Absolute: 3.1 10*3/uL (ref 1.4–7.0)
Neutrophils: 50 %
Platelets: 220 10*3/uL (ref 150–450)
RBC: 4.06 x10E6/uL (ref 3.77–5.28)
RDW: 13.5 % (ref 11.7–15.4)
WBC: 6.2 10*3/uL (ref 3.4–10.8)

## 2021-06-05 LAB — MICROALBUMIN / CREATININE URINE RATIO
Creatinine, Urine: 213.5 mg/dL
Microalb/Creat Ratio: 2 mg/g creat (ref 0–29)
Microalbumin, Urine: 5.2 ug/mL

## 2021-06-05 LAB — COMPREHENSIVE METABOLIC PANEL
ALT: 41 IU/L — ABNORMAL HIGH (ref 0–32)
AST: 34 IU/L (ref 0–40)
Albumin/Globulin Ratio: 1.2 (ref 1.2–2.2)
Albumin: 3.9 g/dL (ref 3.8–4.9)
Alkaline Phosphatase: 96 IU/L (ref 44–121)
BUN/Creatinine Ratio: 21 (ref 9–23)
BUN: 12 mg/dL (ref 6–24)
Bilirubin Total: 0.5 mg/dL (ref 0.0–1.2)
CO2: 25 mmol/L (ref 20–29)
Calcium: 9.4 mg/dL (ref 8.7–10.2)
Chloride: 104 mmol/L (ref 96–106)
Creatinine, Ser: 0.58 mg/dL (ref 0.57–1.00)
Globulin, Total: 3.3 g/dL (ref 1.5–4.5)
Glucose: 108 mg/dL — ABNORMAL HIGH (ref 70–99)
Potassium: 4.4 mmol/L (ref 3.5–5.2)
Sodium: 140 mmol/L (ref 134–144)
Total Protein: 7.2 g/dL (ref 6.0–8.5)
eGFR: 109 mL/min/{1.73_m2} (ref >59)

## 2021-06-05 LAB — HGB A1C W/O EAG: Hgb A1c MFr Bld: 6.8 % — ABNORMAL HIGH (ref 4.8–5.6)

## 2021-06-05 LAB — LIPID PANEL W/O CHOL/HDL RATIO
Cholesterol, Total: 201 mg/dL — ABNORMAL HIGH (ref 100–199)
HDL: 51 mg/dL (ref >39)
LDL Chol Calc (NIH): 122 mg/dL — ABNORMAL HIGH (ref 0–99)
Triglycerides: 156 mg/dL — ABNORMAL HIGH (ref 0–149)
VLDL Cholesterol Cal: 28 mg/dL (ref 5–40)

## 2021-08-02 ENCOUNTER — Other Ambulatory Visit: Payer: Self-pay | Admitting: Obstetrics and Gynecology

## 2021-08-02 DIAGNOSIS — Z1231 Encounter for screening mammogram for malignant neoplasm of breast: Secondary | ICD-10-CM

## 2021-08-10 ENCOUNTER — Telehealth: Payer: Self-pay

## 2021-08-10 ENCOUNTER — Other Ambulatory Visit: Payer: Self-pay

## 2021-08-10 DIAGNOSIS — E7800 Pure hypercholesterolemia, unspecified: Secondary | ICD-10-CM

## 2021-08-10 NOTE — Telephone Encounter (Signed)
Patient would like refill on fluconazole after having previous infection return. Patient describes vaginal itching on the outside. Previous fluconazole on 06/04/21 helped with the issue, but infection returned at the end of February.   ?

## 2021-08-11 LAB — HEPATIC FUNCTION PANEL
ALT: 26 IU/L (ref 0–32)
AST: 25 IU/L (ref 0–40)
Albumin: 4.3 g/dL (ref 3.8–4.9)
Alkaline Phosphatase: 92 IU/L (ref 44–121)
Bilirubin Total: 0.4 mg/dL (ref 0.0–1.2)
Bilirubin, Direct: 0.1 mg/dL (ref 0.00–0.40)
Total Protein: 6.9 g/dL (ref 6.0–8.5)

## 2021-08-11 LAB — LIPID PANEL W/O CHOL/HDL RATIO
Cholesterol, Total: 142 mg/dL (ref 100–199)
HDL: 48 mg/dL (ref >39)
LDL Chol Calc (NIH): 70 mg/dL (ref 0–99)
Triglycerides: 139 mg/dL (ref 0–149)
VLDL Cholesterol Cal: 24 mg/dL (ref 5–40)

## 2021-08-23 NOTE — Telephone Encounter (Signed)
Patient scheduled to see Dr Amil Amen ? ?

## 2021-08-24 ENCOUNTER — Ambulatory Visit: Payer: Self-pay | Admitting: Internal Medicine

## 2021-08-24 ENCOUNTER — Encounter: Payer: Self-pay | Admitting: Internal Medicine

## 2021-08-24 VITALS — BP 118/82 | HR 72 | Resp 16 | Ht 62.0 in | Wt 212.0 lb

## 2021-08-24 DIAGNOSIS — B379 Candidiasis, unspecified: Secondary | ICD-10-CM | POA: Insufficient documentation

## 2021-08-24 DIAGNOSIS — N761 Subacute and chronic vaginitis: Secondary | ICD-10-CM

## 2021-08-24 DIAGNOSIS — N951 Menopausal and female climacteric states: Secondary | ICD-10-CM

## 2021-08-24 DIAGNOSIS — E7800 Pure hypercholesterolemia, unspecified: Secondary | ICD-10-CM

## 2021-08-24 LAB — POCT WET PREP WITH KOH
Clue Cells Wet Prep HPF POC: NEGATIVE
KOH Prep POC: NEGATIVE
Trichomonas, UA: NEGATIVE

## 2021-08-24 LAB — POCT URINALYSIS DIPSTICK
Bilirubin, UA: NEGATIVE
Blood, UA: NEGATIVE
Glucose, UA: NEGATIVE
Ketones, UA: NEGATIVE
Leukocytes, UA: NEGATIVE
Nitrite, UA: NEGATIVE
Protein, UA: NEGATIVE
Spec Grav, UA: 1.02 (ref 1.010–1.025)
Urobilinogen, UA: 0.2 E.U./dL
pH, UA: 6 (ref 5.0–8.0)

## 2021-08-24 MED ORDER — FLUCONAZOLE 150 MG PO TABS: ORAL_TABLET | ORAL | 0 refills | Status: DC

## 2021-08-24 NOTE — Progress Notes (Signed)
? ? ?  Subjective:  ?  ?Patient ID: Alexis Warren, female   DOB: February 07, 1971, 51 y.o.   MRN: [id number] ? ? ?HPI ? ?Tildon Husky intepreting ? ? Vaginal itching:  was treated for yeast vaginitis in January when here for CPE with resolution of vaginal itching.  States developed symptoms of vaginal itching mid February.  No vaginal discharge.  Itching is generally only in vulvar area, but can be perianal at times as well.  A1C in January was 6.8% when symptomatic previously.   ? ?2.  Hyperlipidemia:   Basically, now at goal with Atorvastatin 20 mg daily. ?Lipid Panel  ?   ?Component Value Date/Time  ? CHOL 142 08/10/2021 0912  ? TRIG 139 08/10/2021 0912  ? HDL 48 08/10/2021 0912  ? CHOLHDL 4 04/08/2011 1123  ? VLDL 22.2 04/08/2011 1123  ? LDLCALC 70 08/10/2021 0912  ? LDLDIRECT 143.9 04/08/2011 1123  ? LABVLDL 24 08/10/2021 0912  ? ? ?3.  Describes hot flashes and night sweats.  No period since 12/2020.  ? ? ?Current Meds  ?Medication Sig  ? atorvastatin (LIPITOR) 20 MG tablet Take 1 tablet (20 mg total) by mouth daily.  ? metFORMIN (GLUCOPHAGE) 500 MG tablet Take 1 tablet (500 mg total) by mouth 2 (two) times daily with a meal.  ? ?No Known Allergies ? ? ?Review of Systems ? ? ? ?Objective:  ? ?BP 118/82 (BP Location: Left Arm, Patient Position: Sitting, Cuff Size: Normal)   Pulse 72   Resp 16   Ht 5\' 2"  (1.575 m)   Wt 212 lb (96.2 kg)   BMI 38.78 kg/m?  ? ?Physical Exam ?NAD ?Abd:  No flank tenderness.  + BS, NT, No HSM or mass ?GU:  external genitalia with chronic moisture exposure appearance--mucosa appears wet, white on surface with fissuring.  Vaginal canal without inflammation.  Scant light yellow vaginal secretions near cervix.  No CMT.  No cervical lesions. ? ? ?Assessment & Plan  ? ?Recurrent yeast vaginitis with good glucose control in a patient with DM:  treat for 3 days with Fluconazole 150 mg daily.  To call if does not resolve or if recurs. ? ?Perimenopause:  discussed dressing in layers so can  take off and put on clothing as needed for internal temp. ? ?Hyperlipidemia:  much improved on statin--continue ? ?

## 2021-08-30 ENCOUNTER — Telehealth: Payer: Self-pay

## 2021-08-30 NOTE — Telephone Encounter (Signed)
Patient called to report that she has finished fluconazole, but issue has continued. Patient is still having itching on the outside.  ?

## 2021-09-01 ENCOUNTER — Other Ambulatory Visit: Payer: Self-pay

## 2021-09-01 MED ORDER — FLUCONAZOLE 150 MG PO TABS: ORAL_TABLET | ORAL | 0 refills | Status: DC

## 2021-09-01 NOTE — Telephone Encounter (Signed)
After discussing with Dr Amil Amen, 2 more tabs of Fluconazole were sent to the pharmacy.  ?

## 2021-09-13 ENCOUNTER — Telehealth: Payer: Self-pay

## 2021-09-13 NOTE — Telephone Encounter (Signed)
Patient called to report that she has finished the fluconazole and is still experiencing discomfort. Will look to give her an appointment  ?

## 2021-09-15 ENCOUNTER — Encounter: Payer: Self-pay | Admitting: Internal Medicine

## 2021-09-15 ENCOUNTER — Ambulatory Visit: Payer: Self-pay | Admitting: Internal Medicine

## 2021-09-15 VITALS — BP 122/82 | HR 72 | Resp 12 | Ht 62.0 in | Wt 218.0 lb

## 2021-09-15 DIAGNOSIS — E119 Type 2 diabetes mellitus without complications: Secondary | ICD-10-CM

## 2021-09-15 DIAGNOSIS — N761 Subacute and chronic vaginitis: Secondary | ICD-10-CM

## 2021-09-15 LAB — POCT WET PREP WITH KOH
Clue Cells Wet Prep HPF POC: NEGATIVE
KOH Prep POC: NEGATIVE
RBC Wet Prep HPF POC: NEGATIVE
Trichomonas, UA: NEGATIVE

## 2021-09-15 MED ORDER — FLUCONAZOLE 150 MG PO TABS: ORAL_TABLET | ORAL | 0 refills | Status: DC

## 2021-09-15 NOTE — Telephone Encounter (Signed)
Patient has been seen by Dr Mulberry °

## 2021-09-15 NOTE — Progress Notes (Signed)
? ? ?  Subjective:  ?  ?Patient ID: Alexis Warren, female   DOB: 28-Jul-1970, 51 y.o.   MRN: [id number] ? ? ?HPI ? ? Yeast vaginitis:  states her symptoms of itching completely resolved with the 3 day course of fluconazole.  Then, 2 days later, itching started up little by little and now just as before with itching.  Does have clear discharge.  No odor.  She is wearing some sort of non-cotton and stretchy fabric for underwear.  Does not wear pads. At home, just wears a pullover sort of housedress without bra or underwear.  She is at work most of the day, walking a lot at [employer], but does not feel she has a lot of sweating.  Last A1C was 6.8% in January.  Does not check her sugars as she does not know how to use glucometer.   ? ?Current Meds  ?Medication Sig  ? metFORMIN (GLUCOPHAGE) 500 MG tablet Take 1 tablet (500 mg total) by mouth 2 (two) times daily with a meal.  ? ?No Known Allergies ? ? ?Review of Systems ? ? ? ?Objective:  ? ?BP 122/82 (BP Location: Left Arm, Patient Position: Sitting, Cuff Size: Normal)   Pulse 72   Resp 12   Ht 5\' 2"  (1.575 m)   Wt 218 lb (98.9 kg)   LMP 09/13/2021 (Exact Date)   BMI 39.87 kg/m?  ? ?Physical Exam ?NAD ?GU:  external vulvar area again with paleness and thickening of skin folds, but not to extent as previous exam.  Scant watery vaginal discharge.  Cervix without lesion.  No CMT.  No uterine or adnexal mass or tenderness.   ? ? ? ?Assessment & Plan  ? ?Subacute vaginitis:  very minimal findings of budding yeast--treat again with fluconazole for longer course.  Also send GC/Chlamydia.  Check 11/13/2021 to be certain her sugars are under good control and not exacerbating this. ?To wear cotton underwear. ?Question whether this is a hormonal issue as she is not having periods regularly --last normal period was in September. ? ?Discussed the atorvastatin is a chronic medication--she apparently did not fill after last month.  Will pick that up today and restart.   ? ?

## 2021-09-16 LAB — HGB A1C W/O EAG: Hgb A1c MFr Bld: 7.1 % — ABNORMAL HIGH (ref 4.8–5.6)

## 2021-09-17 LAB — GC/CHLAMYDIA PROBE AMP
Chlamydia trachomatis, NAA: NEGATIVE
Neisseria Gonorrhoeae by PCR: NEGATIVE

## 2021-09-30 ENCOUNTER — Ambulatory Visit: Payer: No Typology Code available for payment source

## 2021-10-04 ENCOUNTER — Ambulatory Visit: Payer: Medicaid Other | Admitting: Internal Medicine

## 2021-11-17 ENCOUNTER — Other Ambulatory Visit: Payer: Self-pay

## 2021-11-17 DIAGNOSIS — Z1231 Encounter for screening mammogram for malignant neoplasm of breast: Secondary | ICD-10-CM

## 2021-12-02 ENCOUNTER — Ambulatory Visit
Admission: RE | Admit: 2021-12-02 | Discharge: 2021-12-02 | Disposition: A | Discharge status: Home / Self Care | Payer: No Typology Code available for payment source | Source: Ambulatory Visit | Attending: Obstetrics and Gynecology | Admitting: Obstetrics and Gynecology

## 2021-12-02 ENCOUNTER — Ambulatory Visit: Payer: No Typology Code available for payment source

## 2021-12-02 DIAGNOSIS — Z1231 Encounter for screening mammogram for malignant neoplasm of breast: Secondary | ICD-10-CM

## 2021-12-06 ENCOUNTER — Other Ambulatory Visit: Payer: Self-pay | Admitting: Obstetrics and Gynecology

## 2021-12-06 DIAGNOSIS — R928 Other abnormal and inconclusive findings on diagnostic imaging of breast: Secondary | ICD-10-CM

## 2021-12-13 IMAGING — MG MM DIGITAL SCREENING BILAT W/ TOMO AND CAD
8 series · 8 of 24 positions shown · non-contrast
Comparison: None.

CLINICAL DATA: Screening.

EXAM:
DIGITAL SCREENING BILATERAL MAMMOGRAM WITH TOMOSYNTHESIS AND CAD
TECHNIQUE: Bilateral screening digital craniocaudal and mediolateral oblique
mammograms were obtained. Bilateral screening digital breast
tomosynthesis was performed. The images were evaluated with
computer-aided detection.

[R MLO synth-2D]
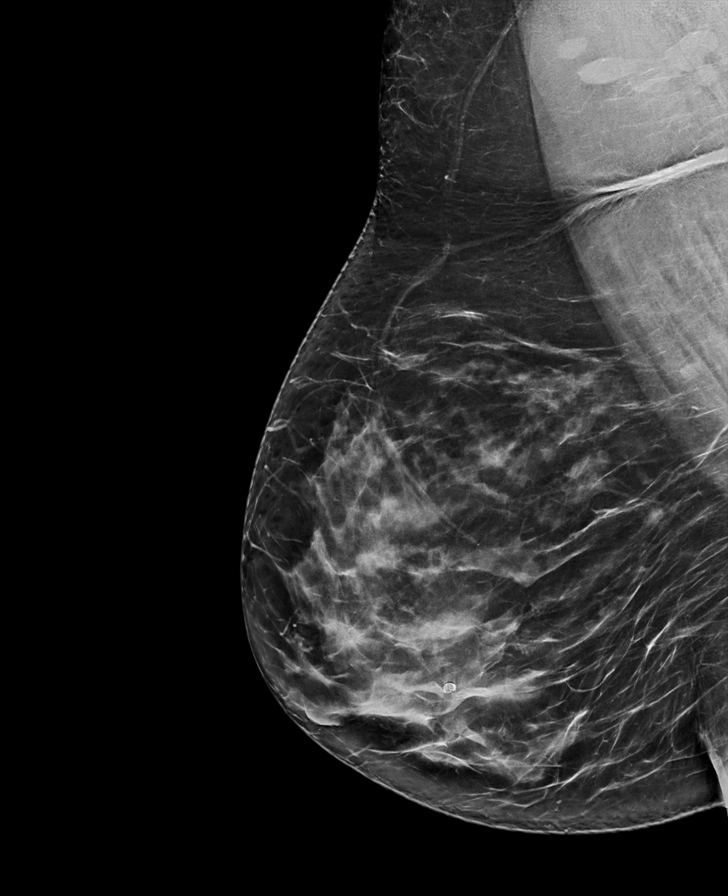

[L CC synth-2D]
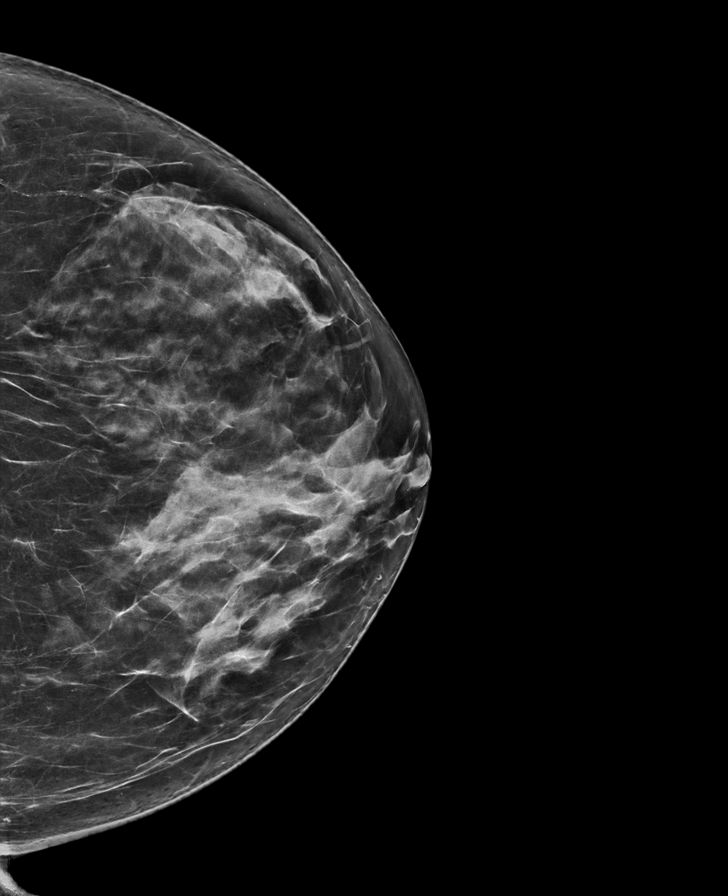

[L MLO synth-2D]
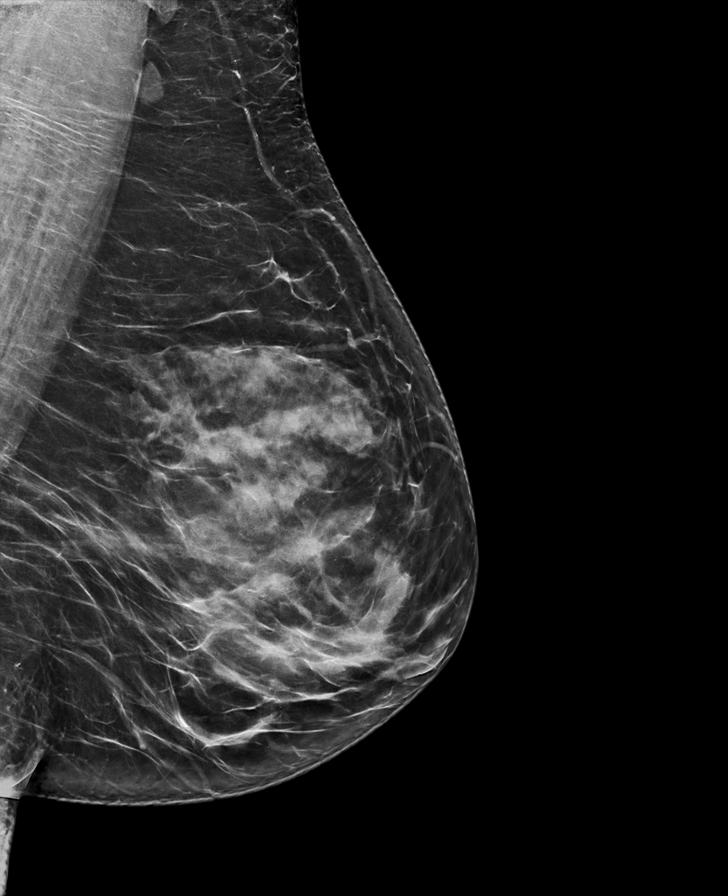

[R CC synth-2D]
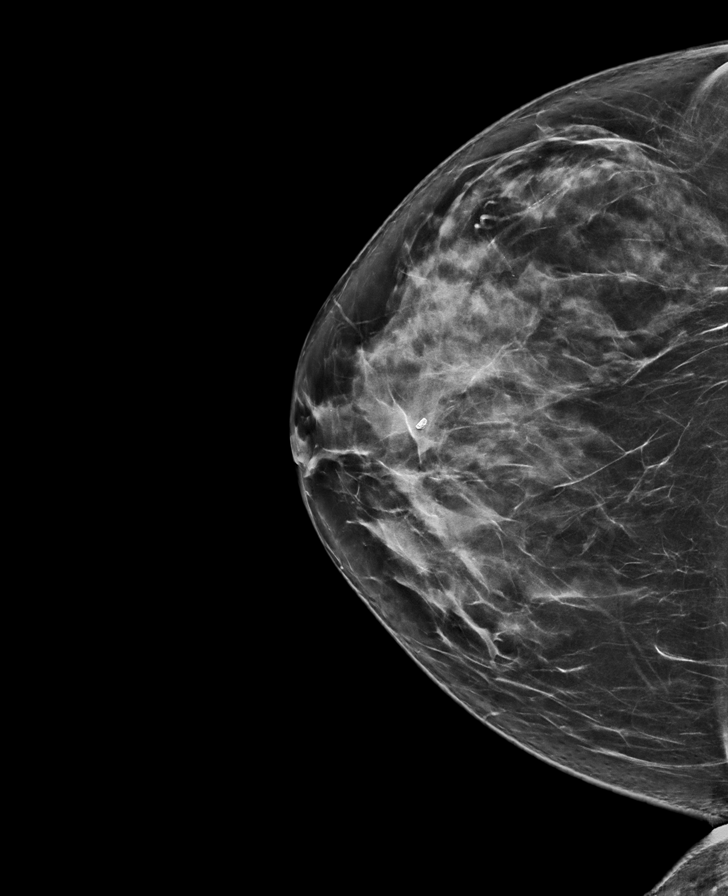

[L MLO tomo · tomo slice 40/79.0]
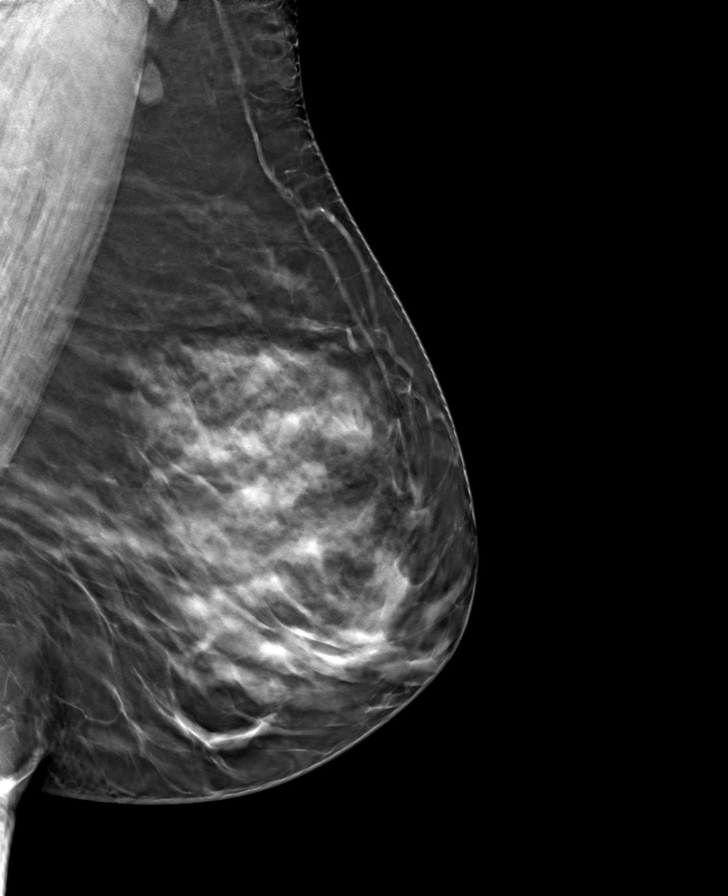

[R CC tomo · tomo slice 39/77.0]
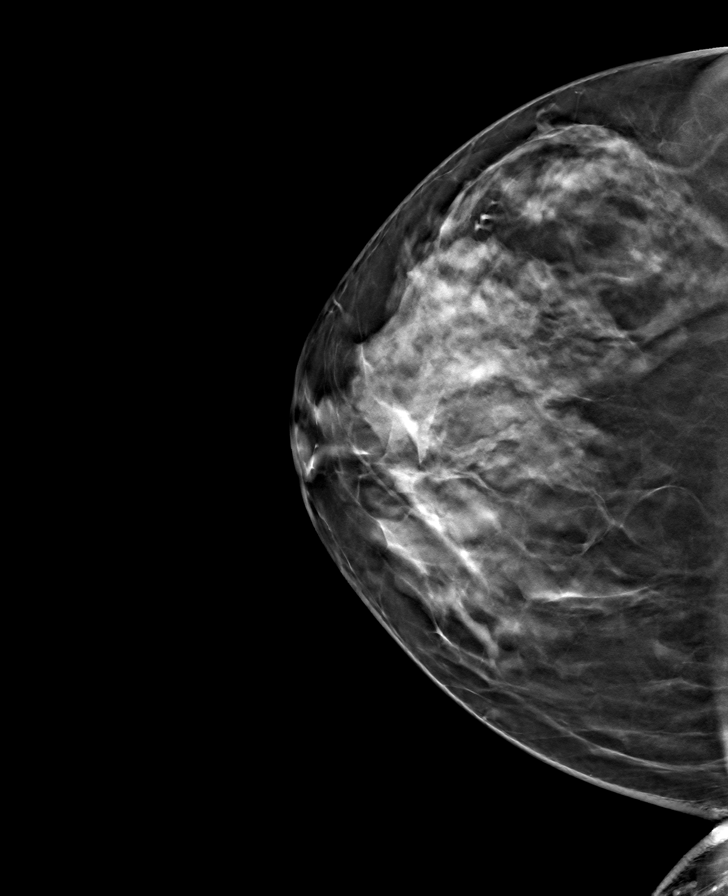

[R MLO tomo · tomo slice 43/84.0]
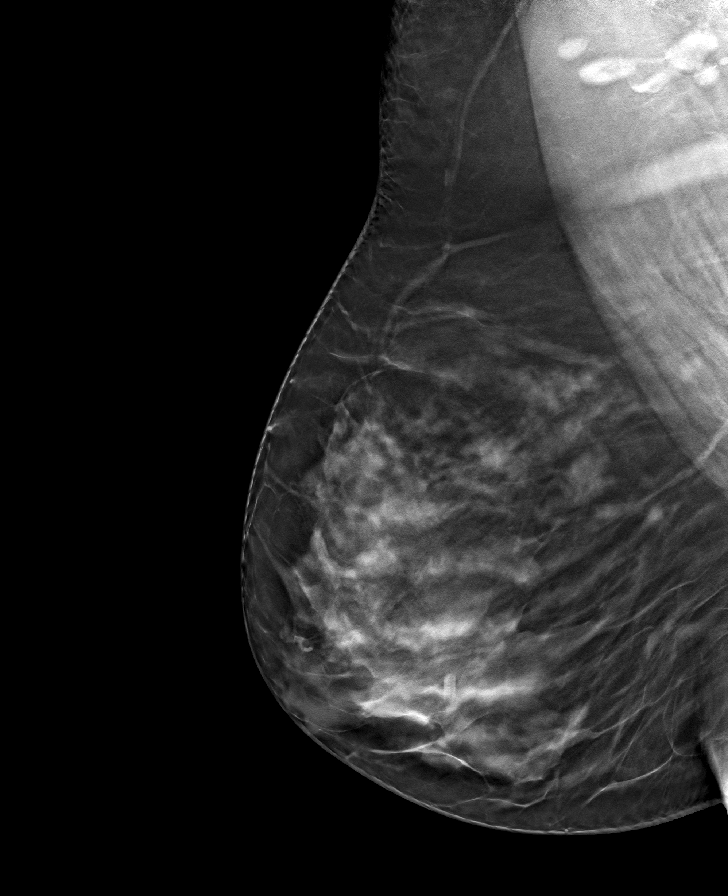

[L CC tomo · tomo slice 37/72.0]
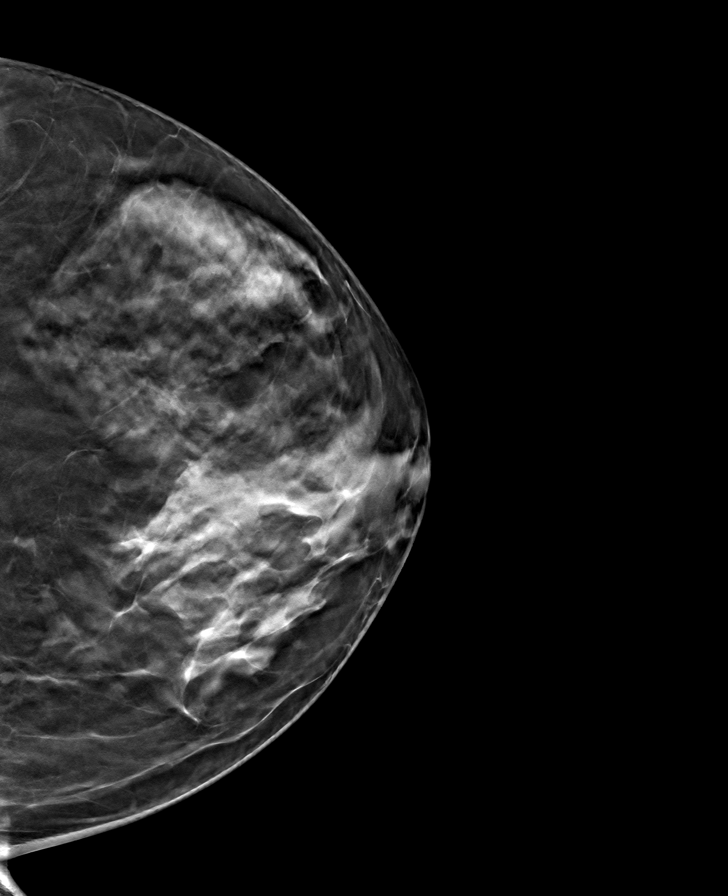

[8 of 24 positions shown; findings below may reference images not displayed]

ACR Breast Density Category c: The breast tissue is heterogeneously
dense, which may obscure small masses
FINDINGS: There are no findings suspicious for malignancy. The images were
evaluated with computer-aided detection.
IMPRESSION: No mammographic evidence of malignancy. A result letter of this
screening mammogram will be mailed directly to the patient.

RECOMMENDATION:
Screening mammogram in one year. (Code:CQ-1-UDH)

BI-RADS CATEGORY  1: Negative.

## 2022-01-06 ENCOUNTER — Ambulatory Visit: Payer: Self-pay | Admitting: Hematology and Oncology

## 2022-01-06 ENCOUNTER — Ambulatory Visit
Admission: RE | Admit: 2022-01-06 | Discharge: 2022-01-06 | Disposition: A | Discharge status: Home / Self Care | Payer: No Typology Code available for payment source | Source: Ambulatory Visit | Attending: Obstetrics and Gynecology | Admitting: Obstetrics and Gynecology

## 2022-01-06 VITALS — BP 105/64 | Wt 216.4 lb

## 2022-01-06 DIAGNOSIS — Z1211 Encounter for screening for malignant neoplasm of colon: Secondary | ICD-10-CM

## 2022-01-06 DIAGNOSIS — R928 Other abnormal and inconclusive findings on diagnostic imaging of breast: Secondary | ICD-10-CM

## 2022-01-06 NOTE — Progress Notes (Addendum)
Alexis Warren is a 51 y.o. female who presents to [facility] clinic today with complaint of left axillary pain.    Pap Smear: Pap not smear completed today. Last Pap smear was 06/03/2020 at [facility] clinic and was normal. Per patient has no history of an abnormal Pap smear. Last Pap smear result is available in Epic.   Physical exam: Breasts Breasts symmetrical. No skin abnormalities bilateral breasts. No nipple retraction bilateral breasts. No nipple discharge bilateral breasts. No lymphadenopathy. No lumps palpated bilateral breasts.     MS DIGITAL SCREENING TOMO BILATERAL  Result Date: 12/03/2021 CLINICAL DATA:  Screening. EXAM: DIGITAL SCREENING BILATERAL MAMMOGRAM WITH TOMOSYNTHESIS AND CAD TECHNIQUE: Bilateral screening digital craniocaudal and mediolateral oblique mammograms were obtained. Bilateral screening digital breast tomosynthesis was performed. The images were evaluated with computer-aided detection. COMPARISON:  Previous exam(s). ACR Breast Density Category c: The breast tissue is heterogeneously dense, which may obscure small masses. FINDINGS: In the left axilla, a possible mass warrants further evaluation. In the right breast, no findings suspicious for malignancy. IMPRESSION: Further evaluation is suggested for possible mass in the left axilla. RECOMMENDATION: Ultrasound of the left axilla. (Code:US-L-60M) The patient will be contacted regarding the findings, and additional imaging will be scheduled. BI-RADS CATEGORY  0: Incomplete. Need additional imaging evaluation and/or prior mammograms for comparison. Electronically Signed   By: Gerome Sam III M.D.   On: 12/03/2021 18:55   MS DIGITAL SCREENING TOMO BILATERAL  Result Date: 07/21/2020 CLINICAL DATA:  Screening. EXAM: DIGITAL SCREENING BILATERAL MAMMOGRAM WITH TOMOSYNTHESIS AND CAD TECHNIQUE: Bilateral screening digital craniocaudal and mediolateral oblique mammograms were obtained. Bilateral screening  digital breast tomosynthesis was performed. The images were evaluated with computer-aided detection. COMPARISON:  None. ACR Breast Density Category c: The breast tissue is heterogeneously dense, which may obscure small masses FINDINGS: There are no findings suspicious for malignancy. The images were evaluated with computer-aided detection. IMPRESSION: No mammographic evidence of malignancy. A result letter of this screening mammogram will be mailed directly to the patient. RECOMMENDATION: Screening mammogram in one year. (Code:SM-B-01Y) BI-RADS CATEGORY  1: Negative. Electronically Signed   By: Frederico Hamman M.D.   On: 07/21/2020 10:37      Pelvic/Bimanual Pap is not indicated today    Smoking History: Patient has is a former smoker and was not referred to quit line.    Patient Navigation: Patient education provided. Access to services provided for patient through [facility] program. [facility] interpreter provided. No transportation provided   Colorectal Cancer Screening: Per patient has never had colonoscopy completed No complaints today. FIT test given today.   Breast and Cervical Cancer Risk Assessment: Patient has family history (maternal grandmother) of breast cancer. Patient does not have history of cervical dysplasia, immunocompromised, or DES exposure in-utero.  Risk Assessment     Risk Scores       01/06/2022 07/16/2020   Last edited by: Meryl Dare, CMA McGill, Sherie Demetrius Charity, LPN   5-year risk: 0.6 % 0.5 %   Lifetime risk: 4.9 % 5 %            A: [facility] exam without pap smear Complaint of left axillary pain with possible mass noted on mammogram. We will obtain diagnostic imaging today.  P: Referred patient to [facility] for a diagnostic mammogram. Appointment scheduled 01/06/2022.  Ilda Basset A, NP 01/06/2022 1:28 PM

## 2022-01-25 ENCOUNTER — Telehealth: Payer: Self-pay

## 2022-01-25 NOTE — Telephone Encounter (Signed)
Patient called to report that she is having vaginal itching for about a week. Issue has gotten worse since start. Itching on the outside and no other symptom. Same complaint as previous visit on 09/15/2021. Patient has not been checking her sugar levels. Patient has not taken anything for this complaint.

## 2022-01-26 ENCOUNTER — Encounter: Payer: Self-pay | Admitting: Internal Medicine

## 2022-01-26 ENCOUNTER — Ambulatory Visit (INDEPENDENT_AMBULATORY_CARE_PROVIDER_SITE_OTHER): Payer: Medicaid Other | Admitting: Internal Medicine

## 2022-01-26 VITALS — BP 102/72 | HR 80 | Resp 16 | Ht 62.0 in | Wt 213.0 lb

## 2022-01-26 DIAGNOSIS — E119 Type 2 diabetes mellitus without complications: Secondary | ICD-10-CM

## 2022-01-26 DIAGNOSIS — B3731 Acute candidiasis of vulva and vagina: Secondary | ICD-10-CM | POA: Diagnosis not present

## 2022-01-26 LAB — POCT WET PREP WITH KOH
Clue Cells Wet Prep HPF POC: NEGATIVE
KOH Prep POC: NEGATIVE
RBC Wet Prep HPF POC: NEGATIVE
Trichomonas, UA: NEGATIVE
Yeast Wet Prep HPF POC: POSITIVE

## 2022-01-26 MED ORDER — METFORMIN HCL 1000 MG PO TABS: ORAL_TABLET | ORAL | 11 refills | Status: AC

## 2022-01-26 MED ORDER — FLUCONAZOLE 150 MG PO TABS: ORAL_TABLET | ORAL | 0 refills | Status: DC

## 2022-01-26 NOTE — Patient Instructions (Signed)

## 2022-01-26 NOTE — Progress Notes (Signed)
    Subjective:    Patient ID: Alexis Warren, female   DOB: 06/06/1970, 51 y.o.   MRN: [id number]   HPI  Lot of itching in vulvar area again.  No discharge or odor.  Continues to have recurrent yeast vaginitis.  Her symptoms continue to resolve after treatment with oral Fluconazole.   She wears cotton underwear Does not wear clothing to bed. Sometimes wears snug pants, but has not paid attention to whether wearing the tighter clothing precedes her getting a yeast infection. Using a Vagisil brand soap for "intimate areas" to wash in the area.  Has used for years.   Does not bathe in tub. Uses Suave or Dove liquid soap for rest of body.   Current Meds  Medication Sig   atorvastatin (LIPITOR) 20 MG tablet Take 1 tablet (20 mg total) by mouth daily.   metFORMIN (GLUCOPHAGE) 500 MG tablet Take 1 tablet (500 mg total) by mouth 2 (two) times daily with a meal.   No Known Allergies   Review of Systems    Objective:   BP 102/72 (BP Location: Right Arm, Patient Position: Sitting, Cuff Size: Normal)   Pulse 80   Resp 16   Ht 5\' 2"  (1.575 m)   Wt 213 lb (96.6 kg)   BMI 38.96 kg/m   Physical Exam GU: mild thin white discharge.  Vulvar area appears chronically exposed to moisture with mild swelling and underlying inflammation with paleness of mucosa.  No odor.  No vaginal or cervical lesion.   Assessment & Plan  Yeast vaginitis:  Will try a longer course of Fluconazole 150 mg daily for 5 days.  Needs to make sure controlling DM as well.   Dove bar soap for sensitive skin. No tight clothing. Cotton underwear and loose fitting bottoms with sleep or no bottoms at all.

## 2022-01-28 NOTE — Telephone Encounter (Signed)
Patient has been seen.

## 2022-01-31 ENCOUNTER — Telehealth: Payer: Self-pay

## 2022-01-31 NOTE — Telephone Encounter (Signed)
Patient called to request fluconazole refill. Patient finished the 5 day rx and itching was reduced but has not entirely gone away.

## 2022-02-01 NOTE — Telephone Encounter (Signed)
Patient has been schedule.

## 2022-02-02 ENCOUNTER — Ambulatory Visit: Payer: Medicaid Other | Admitting: Internal Medicine

## 2022-02-02 VITALS — BP 118/80 | HR 80 | Resp 12 | Ht 62.0 in | Wt 210.0 lb

## 2022-02-02 DIAGNOSIS — N761 Subacute and chronic vaginitis: Secondary | ICD-10-CM

## 2022-02-02 DIAGNOSIS — E119 Type 2 diabetes mellitus without complications: Secondary | ICD-10-CM

## 2022-02-02 LAB — POCT WET PREP WITH KOH
Clue Cells Wet Prep HPF POC: NEGATIVE
KOH Prep POC: NEGATIVE
RBC Wet Prep HPF POC: NEGATIVE
Trichomonas, UA: NEGATIVE
Yeast Wet Prep HPF POC: NEGATIVE

## 2022-02-02 MED ORDER — NYSTATIN-TRIAMCINOLONE 100000-0.1 UNIT/GM-% EX OINT: 1.0000 | TOPICAL_OINTMENT | Freq: Two times a day (BID) | CUTANEOUS | 0 refills | Status: DC

## 2022-02-02 NOTE — Progress Notes (Signed)
    Subjective:    Patient ID: Alexis Warren, female   DOB: Feb 26, 1971, 51 y.o.   MRN: [id number]   HPI  Vaginal itching, felt to be yeast vaginitis:  Took Fluconazole 150 mg daily for 5 day course, the last dose on Sunday, 3 days ago.  Feels she is about 50% improved.  The itching really is not improved, but describes having blisters where she was scratching and fluid would come out--states no longer has this.  States she has had this when she scratches with each episode of vaginitis in past year, but have not heard this history previously.   She used unknown topical (calamine lotion) for poison ivy for the itching--4-5 times daily since completing Fluconazole.  Looking back at her records, she has had issues with this dating back to 2014 and does not appear a definitive causative agent was found.  Has been treated with Fluconazole frequently to see if would go away.  States when she was pregnant age 73, had similar issue  Was treated with unknown med (IM).  She has no idea what she was told the problem was.  Never had a problem again until 2014.  She did not carry diagnosis of DM at the time. Has had 4 episodes of external vaginal itching since 06/04/2021.  Bought a new washing machine that is at her long term boyfriend's and she started washing her clothes there about 1.5 years ago.  She is not sure if high efficiency and is not sure if detergent she is using is high efficiency (from [place]).  She is washing her clothes with soap and then running through another wash cycle without soap.   She is using a liquid fabric softener she has used all her life.  Not sure if its the correct volume.     DM:  has not idea what her sugars are running.  Cannot seem to figure out how to use the glucometer without her son there.  He has instructed her several times, but she has not been able to figure it out.   Last A1C was 7.1% in May.      Current Meds  Medication Sig   atorvastatin (LIPITOR) 20  MG tablet Take 1 tablet (20 mg total) by mouth daily.   calamine lotion Apply 1 Application topically as needed for itching.   metFORMIN (GLUCOPHAGE) 1000 MG tablet 1 tab by mouth twice daily  with meals   No Known Allergies   Review of Systems    Objective:   BP 118/80 (BP Location: Left Arm, Patient Position: Sitting, Cuff Size: Normal)   Pulse 80   Resp 12   Ht 5\' 2"  (1.575 m)   Wt 210 lb (95.3 kg)   BMI 38.41 kg/m   Physical Exam Fissuring and loose flat blistering at posterior aspect of vulvar area and perineum.  No vaginal discharge or inflammation of vaginal canal or cervix.    Assessment & Plan   Chronic recurrent vulvovaginitis:  not clear if this is a chemical irritation/contact dermatitis.  Will treat with nystatin triamcinolone cream twice daily for about 7 days and then see if controlled.  May use thereafter only as needed and not long term.  2.  DM:  To make a video with her son on her phone as he walks her through how to utilize the glucometer so she has something to refer to when needs to check blood glucose.Marland Kitchen

## 2022-02-03 LAB — HGB A1C W/O EAG: Hgb A1c MFr Bld: 7.4 % — ABNORMAL HIGH (ref 4.8–5.6)

## 2022-03-01 ENCOUNTER — Ambulatory Visit: Payer: Medicaid Other | Admitting: Internal Medicine

## 2022-03-10 ENCOUNTER — Encounter: Payer: Self-pay | Admitting: Internal Medicine

## 2022-03-10 ENCOUNTER — Ambulatory Visit: Payer: Medicaid Other | Admitting: Internal Medicine

## 2022-03-10 VITALS — BP 110/70 | HR 80 | Resp 16 | Ht 62.0 in | Wt 212.0 lb

## 2022-03-10 DIAGNOSIS — N761 Subacute and chronic vaginitis: Secondary | ICD-10-CM

## 2022-03-10 MED ORDER — NYSTATIN-TRIAMCINOLONE 100000-0.1 UNIT/GM-% EX OINT: 1.0000 | TOPICAL_OINTMENT | Freq: Two times a day (BID) | CUTANEOUS | 0 refills | Status: AC

## 2022-03-10 NOTE — Progress Notes (Signed)
    Subjective:    Patient ID: Alexis Warren, female   DOB: Jun 19, 1970, 51 y.o.   MRN: [id number]   HPI  Vulvar irritation and itching:  has been using nystatin-triamcinolone cream twice daily for 7 days and itching resolved.  She is using vaseline for what she feels is dryness on the skin, not the mucous membrane.  Uses actually all over for dry skin.      Current Meds  Medication Sig   atorvastatin (LIPITOR) 20 MG tablet Take 1 tablet (20 mg total) by mouth daily.   metFORMIN (GLUCOPHAGE) 1000 MG tablet 1 tab by mouth twice daily  with meals   nystatin-triamcinolone ointment (MYCOLOG) Apply 1 Application topically 2 (two) times daily.   No Known Allergies   Review of Systems    Objective:   BP 110/70 (BP Location: Left Arm, Patient Position: Sitting, Cuff Size: Normal)   Pulse 80   Resp 16   Ht 5\' 2"  (1.575 m)   Wt 212 lb (96.2 kg)   BMI 38.78 kg/m   Physical Exam  GU:  No inflammation of vulvar area, no discharge.  Minimal residual fissuring in vulvar folds.  No blistering noted   Assessment & Plan  Recurrent vulvovaginitis:   much improved with nystatin triamcinolone cream To treat for one more week. May treat recurrences for 1-2 weeks with same cream

## 2022-03-10 NOTE — Patient Instructions (Signed)
[place] crema una semana mas

## 2022-04-08 DIAGNOSIS — Z419 Encounter for procedure for purposes other than remedying health state, unspecified: Secondary | ICD-10-CM | POA: Diagnosis not present

## 2022-05-09 DIAGNOSIS — Z419 Encounter for procedure for purposes other than remedying health state, unspecified: Secondary | ICD-10-CM | POA: Diagnosis not present

## 2022-05-31 ENCOUNTER — Other Ambulatory Visit: Payer: Medicaid Other

## 2022-06-06 ENCOUNTER — Other Ambulatory Visit: Payer: Self-pay | Admitting: Internal Medicine

## 2022-06-09 DIAGNOSIS — Z419 Encounter for procedure for purposes other than remedying health state, unspecified: Secondary | ICD-10-CM | POA: Diagnosis not present

## 2022-07-08 DIAGNOSIS — Z419 Encounter for procedure for purposes other than remedying health state, unspecified: Secondary | ICD-10-CM | POA: Diagnosis not present

## 2022-08-08 DIAGNOSIS — Z419 Encounter for procedure for purposes other than remedying health state, unspecified: Secondary | ICD-10-CM | POA: Diagnosis not present

## 2022-09-07 DIAGNOSIS — Z419 Encounter for procedure for purposes other than remedying health state, unspecified: Secondary | ICD-10-CM | POA: Diagnosis not present

## 2022-10-08 DIAGNOSIS — Z419 Encounter for procedure for purposes other than remedying health state, unspecified: Secondary | ICD-10-CM | POA: Diagnosis not present

## 2022-11-07 DIAGNOSIS — Z419 Encounter for procedure for purposes other than remedying health state, unspecified: Secondary | ICD-10-CM | POA: Diagnosis not present

## 2022-12-08 DIAGNOSIS — Z419 Encounter for procedure for purposes other than remedying health state, unspecified: Secondary | ICD-10-CM | POA: Diagnosis not present

## 2023-01-08 DIAGNOSIS — Z419 Encounter for procedure for purposes other than remedying health state, unspecified: Secondary | ICD-10-CM | POA: Diagnosis not present

## 2023-02-07 DIAGNOSIS — Z419 Encounter for procedure for purposes other than remedying health state, unspecified: Secondary | ICD-10-CM | POA: Diagnosis not present

## 2023-03-10 DIAGNOSIS — Z419 Encounter for procedure for purposes other than remedying health state, unspecified: Secondary | ICD-10-CM | POA: Diagnosis not present

## 2023-04-09 DIAGNOSIS — Z419 Encounter for procedure for purposes other than remedying health state, unspecified: Secondary | ICD-10-CM | POA: Diagnosis not present

## 2023-05-10 DIAGNOSIS — Z419 Encounter for procedure for purposes other than remedying health state, unspecified: Secondary | ICD-10-CM | POA: Diagnosis not present

## 2023-06-10 DIAGNOSIS — Z419 Encounter for procedure for purposes other than remedying health state, unspecified: Secondary | ICD-10-CM | POA: Diagnosis not present

## 2023-07-08 DIAGNOSIS — Z419 Encounter for procedure for purposes other than remedying health state, unspecified: Secondary | ICD-10-CM | POA: Diagnosis not present

## 2023-08-19 DIAGNOSIS — Z419 Encounter for procedure for purposes other than remedying health state, unspecified: Secondary | ICD-10-CM | POA: Diagnosis not present

## 2023-09-18 DIAGNOSIS — Z419 Encounter for procedure for purposes other than remedying health state, unspecified: Secondary | ICD-10-CM | POA: Diagnosis not present

## 2023-10-19 DIAGNOSIS — Z419 Encounter for procedure for purposes other than remedying health state, unspecified: Secondary | ICD-10-CM | POA: Diagnosis not present
